# Patient Record
Sex: Female | Born: 1950
Health system: Southern US, Community
[De-identification: ages and names within clinical notes are randomized; demographics above are authoritative.]

## PROBLEM LIST (undated history)

## (undated) DIAGNOSIS — E785 Hyperlipidemia, unspecified: Secondary | ICD-10-CM

## (undated) DIAGNOSIS — M1990 Unspecified osteoarthritis, unspecified site: Secondary | ICD-10-CM

## (undated) DIAGNOSIS — D649 Anemia, unspecified: Secondary | ICD-10-CM

## (undated) DIAGNOSIS — I10 Essential (primary) hypertension: Secondary | ICD-10-CM

## (undated) DIAGNOSIS — J45909 Unspecified asthma, uncomplicated: Secondary | ICD-10-CM

## (undated) DIAGNOSIS — E119 Type 2 diabetes mellitus without complications: Secondary | ICD-10-CM

## (undated) DIAGNOSIS — F419 Anxiety disorder, unspecified: Secondary | ICD-10-CM

## (undated) HISTORY — PX: ABDOMINAL HYSTERECTOMY: SHX81

## (undated) HISTORY — PX: BACK SURGERY: SHX140

## (undated) HISTORY — PX: ECTOPIC PREGNANCY SURGERY: SHX613

## (undated) HISTORY — PX: TUBAL LIGATION: SHX77

## (undated) HISTORY — DX: Hyperlipidemia, unspecified: E78.5

---

## 2019-02-22 ENCOUNTER — Other Ambulatory Visit: Payer: Self-pay | Admitting: Internal Medicine

## 2019-02-22 DIAGNOSIS — Z1231 Encounter for screening mammogram for malignant neoplasm of breast: Secondary | ICD-10-CM

## 2019-02-22 DIAGNOSIS — I10 Essential (primary) hypertension: Secondary | ICD-10-CM | POA: Diagnosis not present

## 2019-02-22 DIAGNOSIS — E119 Type 2 diabetes mellitus without complications: Secondary | ICD-10-CM | POA: Diagnosis not present

## 2019-02-22 DIAGNOSIS — Z13820 Encounter for screening for osteoporosis: Secondary | ICD-10-CM | POA: Diagnosis not present

## 2019-02-22 DIAGNOSIS — M48061 Spinal stenosis, lumbar region without neurogenic claudication: Secondary | ICD-10-CM | POA: Diagnosis not present

## 2019-02-22 DIAGNOSIS — E210 Primary hyperparathyroidism: Secondary | ICD-10-CM | POA: Diagnosis not present

## 2019-02-22 DIAGNOSIS — Z0000 Encounter for general adult medical examination without abnormal findings: Secondary | ICD-10-CM | POA: Diagnosis not present

## 2019-02-22 DIAGNOSIS — M5137 Other intervertebral disc degeneration, lumbosacral region: Secondary | ICD-10-CM | POA: Diagnosis not present

## 2019-02-28 DIAGNOSIS — Z780 Asymptomatic menopausal state: Secondary | ICD-10-CM | POA: Diagnosis not present

## 2019-03-02 DIAGNOSIS — E213 Hyperparathyroidism, unspecified: Secondary | ICD-10-CM | POA: Diagnosis not present

## 2019-03-10 DIAGNOSIS — I10 Essential (primary) hypertension: Secondary | ICD-10-CM | POA: Diagnosis not present

## 2019-03-10 DIAGNOSIS — E213 Hyperparathyroidism, unspecified: Secondary | ICD-10-CM | POA: Diagnosis not present

## 2019-03-11 DIAGNOSIS — I10 Essential (primary) hypertension: Secondary | ICD-10-CM | POA: Diagnosis not present

## 2019-03-16 DIAGNOSIS — E213 Hyperparathyroidism, unspecified: Secondary | ICD-10-CM | POA: Diagnosis not present

## 2019-03-23 DIAGNOSIS — I10 Essential (primary) hypertension: Secondary | ICD-10-CM | POA: Diagnosis not present

## 2019-03-23 DIAGNOSIS — Z0000 Encounter for general adult medical examination without abnormal findings: Secondary | ICD-10-CM | POA: Diagnosis not present

## 2019-03-30 DIAGNOSIS — I10 Essential (primary) hypertension: Secondary | ICD-10-CM | POA: Diagnosis not present

## 2019-03-30 DIAGNOSIS — E213 Hyperparathyroidism, unspecified: Secondary | ICD-10-CM | POA: Diagnosis not present

## 2019-03-30 DIAGNOSIS — E042 Nontoxic multinodular goiter: Secondary | ICD-10-CM | POA: Diagnosis not present

## 2019-04-06 DIAGNOSIS — I10 Essential (primary) hypertension: Secondary | ICD-10-CM | POA: Diagnosis not present

## 2019-04-06 DIAGNOSIS — E210 Primary hyperparathyroidism: Secondary | ICD-10-CM | POA: Diagnosis not present

## 2019-04-06 DIAGNOSIS — E042 Nontoxic multinodular goiter: Secondary | ICD-10-CM | POA: Diagnosis not present

## 2019-04-07 DIAGNOSIS — E042 Nontoxic multinodular goiter: Secondary | ICD-10-CM | POA: Diagnosis not present

## 2019-04-15 ENCOUNTER — Other Ambulatory Visit: Payer: Self-pay

## 2019-04-15 ENCOUNTER — Encounter: Payer: Self-pay | Admitting: Emergency Medicine

## 2019-04-15 ENCOUNTER — Emergency Department
Admission: EM | Admit: 2019-04-15 | Discharge: 2019-04-15 | Disposition: A | Discharge status: Home / Self Care | Payer: PPO | Attending: Emergency Medicine | Admitting: Emergency Medicine

## 2019-04-15 ENCOUNTER — Emergency Department: Payer: PPO

## 2019-04-15 DIAGNOSIS — E119 Type 2 diabetes mellitus without complications: Secondary | ICD-10-CM | POA: Insufficient documentation

## 2019-04-15 DIAGNOSIS — I10 Essential (primary) hypertension: Secondary | ICD-10-CM | POA: Diagnosis not present

## 2019-04-15 DIAGNOSIS — R42 Dizziness and giddiness: Secondary | ICD-10-CM | POA: Diagnosis not present

## 2019-04-15 DIAGNOSIS — R0789 Other chest pain: Secondary | ICD-10-CM | POA: Diagnosis not present

## 2019-04-15 HISTORY — DX: Essential (primary) hypertension: I10

## 2019-04-15 HISTORY — DX: Type 2 diabetes mellitus without complications: E11.9

## 2019-04-15 LAB — BASIC METABOLIC PANEL
Anion gap: 6 (ref 5–15)
BUN: 15 mg/dL (ref 8–23)
CO2: 23 mmol/L (ref 22–32)
Calcium: 11.1 mg/dL — ABNORMAL HIGH (ref 8.9–10.3)
Chloride: 108 mmol/L (ref 98–111)
Creatinine, Ser: 0.75 mg/dL (ref 0.44–1.00)
GFR calc Af Amer: >60 mL/min (ref >60)
GFR calc non Af Amer: >60 mL/min (ref >60)
Glucose, Bld: 150 mg/dL — ABNORMAL HIGH (ref 70–99)
Potassium: 4.1 mmol/L (ref 3.5–5.1)
Sodium: 137 mmol/L (ref 135–145)

## 2019-04-15 LAB — CBC
HCT: 40.1 % (ref 36.0–46.0)
Hemoglobin: 13.4 g/dL (ref 12.0–15.0)
MCH: 30 pg (ref 26.0–34.0)
MCHC: 33.4 g/dL (ref 30.0–36.0)
MCV: 89.9 fL (ref 80.0–100.0)
Platelets: 278 10*3/uL (ref 150–400)
RBC: 4.46 MIL/uL (ref 3.87–5.11)
RDW: 13.4 % (ref 11.5–15.5)
WBC: 7.9 10*3/uL (ref 4.0–10.5)
nRBC: 0 % (ref 0.0–0.2)

## 2019-04-15 LAB — TROPONIN I (HIGH SENSITIVITY)
Troponin I (High Sensitivity): 9 ng/L (ref <18)
Troponin I (High Sensitivity): 10 ng/L (ref <18)

## 2019-04-15 MED ORDER — SODIUM CHLORIDE 0.9% FLUSH: 3.0000 mL | Freq: Once | INTRAVENOUS | Status: DC

## 2019-04-15 NOTE — ED Provider Notes (Signed)
[facility] Emergency Department Provider Note   ____________________________________________   First MD Initiated Contact with Patient 04/15/19 2033     (approximate)  I have reviewed the triage vital signs and the nursing notes.   HISTORY  Chief Complaint Lightheadedness    HPI Mariah Proctor is a 69 y.o. female has been experiencing lightheadedness   Patient reports that she has been having a slight bit of a nervous feeling as well as a lightheaded feeling that been ongoing for about a week.  Is more prominent when she is up and about.  She been having her blood pressure medications adjusted and was taken off of her fluid pill and placed on new blood pressure medicine by her primary doctor Dr. Tressia Miners  Denies numbness or weakness in arms or legs.  She does not feel a spinning sensation.  She reports she just has a feeling that is like a lightheadedness has been going off and on for a few days now  She thinks her symptoms might be related to that.  Symptoms are more prominent when she stands.  Tends to go away when she is at rest.  She has been suffering from high blood pressure  Patient denies having chest pain.  She reports she gets a little bit of soreness around her thyroid which is been an issue that is being followed by her doctor.  She reports is not having chest pain.  The thyroid discomfort is not new located in her upper chest lower neck  She has not felt like she could pass out.    Past Medical History:  Diagnosis Date  . Diabetes mellitus without complication ([place])   . Hypertension     There are no problems to display for this patient.   Past Surgical History:  Procedure Laterality Date  . ABDOMINAL HYSTERECTOMY    . BACK SURGERY    . ECTOPIC PREGNANCY SURGERY      Prior to Admission medications   Not on File    Allergies Patient has no known allergies.  History reviewed. No pertinent family history.  Social  History Social History   Tobacco Use  . Smoking status: Never Smoker  . Smokeless tobacco: Never Used  Substance Use Topics  . Alcohol use: Never  . Drug use: Never    Review of Systems Constitutional: No fever/chills Eyes: No visual changes. ENT: No sore throat.  Some discomfort over the front of her neck which has been present for some time Cardiovascular: Denies chest pain. Respiratory: Denies shortness of breath.   Gastrointestinal: No abdominal pain.   Genitourinary: Negative for dysuria. Musculoskeletal: Negative for back pain. Skin: Negative for rash. Neurological: Negative for headaches, areas of focal weakness or numbness.  Reports feeling of "lightheadedness"    ____________________________________________   PHYSICAL EXAM:  VITAL SIGNS: ED Triage Vitals  Enc Vitals Group     BP 04/15/19 1657 (!) 157/65     Pulse Rate 04/15/19 1657 (!) 101     Resp 04/15/19 1657 20     Temp 04/15/19 1657 98.7 F (37.1 C)     Temp Source 04/15/19 1657 Oral     SpO2 04/15/19 1657 94 %     Weight 04/15/19 1656 255 lb (115.7 kg)     Height 04/15/19 1656 5\' 8"  (1.727 m)     Head Circumference --      Peak Flow --      Pain Score 04/15/19 1656 0  Pain Loc --      Pain Edu? --      Excl. in [place]? --     Constitutional: Alert and oriented. Well appearing and in no acute distress. Eyes: Conjunctivae are normal. Head: Atraumatic. Nose: No congestion/rhinnorhea. Mouth/Throat: Mucous membranes are moist. Neck: No stridor.  Cardiovascular: Normal rate, regular rhythm. Grossly normal heart sounds.  Good peripheral circulation. Respiratory: Normal respiratory effort.  No retractions. Lungs CTAB. Gastrointestinal: Soft and nontender. No distention. Musculoskeletal: No lower extremity tenderness nor edema. Neurologic:  Normal speech and language. No gross focal neurologic deficits are appreciated.  Moves all extremities with 5 out of 5 strength.  Able to stand independently  without difficulty.  No central ataxia.  Clear speech.  Normal facial movements without droop. Skin:  Skin is warm, dry and intact. No rash noted. Psychiatric: Mood and affect are normal. Speech and behavior are normal.  I personally checked orthostatics on the patient, she does not have orthostatic hypotension.  Her blood pressure actually went up slightly with standing to as high as 231/121. ____________________________________________   LABS (all labs ordered are listed, but only abnormal results are displayed)  Labs Reviewed  BASIC METABOLIC PANEL - Abnormal; Notable for the following components:      Result Value   Glucose, Bld 150 (*)    Calcium 11.1 (*)    All other components within normal limits  CBC  TROPONIN I (HIGH SENSITIVITY)  TROPONIN I (HIGH SENSITIVITY)   ____________________________________________  EKG  Reviewed interpreted by me at 1700 Heart rate 110 QRS 80 QTc 410 Mild sinus tachycardia, probable early repolarization type abnormality.  EKG discussed with cardiology Dr. Audie Box ____________________________________________  RADIOLOGY  DG Chest 2 View  Result Date: 04/15/2019 CLINICAL DATA:  Chest tightness EXAM: CHEST - 2 VIEW COMPARISON:  None. FINDINGS: The heart size and mediastinal contours are within normal limits. Both lungs are clear. The visualized skeletal structures show degenerative changes of the thoracic spine. IMPRESSION: No active cardiopulmonary disease. Electronically Signed   By: Inez Catalina M.D.   On: 04/15/2019 17:29    Chest x-ray reviewed negative for acute  Do not see indication for neuroimaging.  No neurologic symptoms reported, does report some lightheadedness.  No headaches.  Reassuring neurologic exam ____________________________________________   PROCEDURES  Procedure(s) performed: None  Procedures  Critical Care performed: No  ____________________________________________   INITIAL IMPRESSION / ASSESSMENT AND PLAN  / ED COURSE  Pertinent labs & imaging results that were available during my care of the patient were reviewed by me and considered in my medical decision making (see chart for details).   Patient presents for evaluation for lightheadedness.  This in the setting of recent blood pressure medication adjustments.  She has reassuring neurologic examination.  Denies cardiac symptoms.  Does report some discomfort across the front of her thyroid region where she has had this evaluated and denies this to be new  Clinical Course as of Apr 14 2236  Fri Apr 15, 2019  2232 Patient seated resting comfortably in chair.  Blood pressure currently 159/93.  Discussed her case with cardiology Dr. Ulyses Amor, and reports quite a bit of room to titrate her medications.  Her blood pressure seems extremely variable.  It is now improved, and she is resting quite comfortably.  Discussed with the patient and after discussion with cardiology will trial changing her hydralazine to 3 times daily instead of twice daily dosing, have her follow-up with her primary care Dr. Tressia Miners.  Patient very comfortable  with this plan  Return precautions and treatment recommendations and follow-up discussed with the patient who is agreeable with the plan.    [MQ]    Clinical Course User Index [MQ] Delman Kitten, MD   No evidence of acute neurologic cardiac or vascular etiology.  Discussed with the patient, we will up titrate her hydralazine to 3 times daily, she will continue to follow her blood pressures at home and follow closely with Dr. Tressia Miners.  Patient comfortable with this plan  2 normal troponins.  No chest pain.  No evidence of ACS  Return precautions and treatment recommendations and follow-up discussed with the patient who is agreeable with the plan.   ____________________________________________   FINAL CLINICAL IMPRESSION(S) / ED DIAGNOSES  Final diagnoses:  Hypertension, unspecified type  Lightheadedness         Note:  This document was prepared using Dragon voice recognition software and may include unintentional dictation errors       Delman Kitten, MD 04/15/19 2239

## 2019-04-15 NOTE — Discharge Instructions (Addendum)
Please follow-up closely with Dr. Tressia Miners.  Please change your hydralazine from twice daily to 3 times a day dosing, which would be roughly every 8 hours.  This may help to better control your blood pressures.  Return the emergency room right away if you have worsening symptoms, begin experiencing chest pain, trouble breathing, weakness in arm or leg, trouble speaking or other new concerns or symptoms arise

## 2019-04-15 NOTE — ED Notes (Addendum)
Pt states she has had dizzy spells w/ lightheadedness off and on since last Sunday. Pt went to get checked at [facility] clinic walkin and was sent to this ED for HTN. Pt states she has hx of HTN but has been compliant w/ her medication.  Pt is denying chest pain at this time.

## 2019-04-15 NOTE — ED Triage Notes (Signed)
Pt presents to ED via POV, pt c/o dizziness, substernal chest tightness, and a "nervous feeling". Pt states symptoms started on Sunday and have progressively worsened. Pt A&O x 4. NAD noted in triage.

## 2019-04-15 NOTE — ED Notes (Signed)
Family updated as to patient's status. Called pt's husband Rufus.

## 2019-04-21 ENCOUNTER — Other Ambulatory Visit: Payer: Self-pay

## 2019-04-21 ENCOUNTER — Ambulatory Visit: Payer: PPO | Admitting: General Surgery

## 2019-04-21 ENCOUNTER — Encounter: Payer: Self-pay | Admitting: General Surgery

## 2019-04-21 VITALS — BP 165/82 | HR 111 | Temp 97.7°F | Resp 12 | Ht 65.0 in | Wt 253.0 lb

## 2019-04-21 DIAGNOSIS — E210 Primary hyperparathyroidism: Secondary | ICD-10-CM | POA: Diagnosis not present

## 2019-04-21 NOTE — H&P (View-Only) (Signed)
Patient ID: Mariah Proctor, female   DOB: 12-23-1950, 69 y.o.   MRN: [id number]  Chief Complaint  Patient presents with   New Patient (Initial Visit)    Parathyroidectomy     HPI Mariah Proctor is a 69 y.o. female.   She has been referred by Dr. Gabriel Carina for surgical evaluation of hypercalcemia and hyperparathyroidism.  She moved to [place] from [place] in November 2020.  She reports that her primary care provider in [place] had notified her, just prior to her move, that her calcium was high.  He placed her on 30 mg of Sensipar daily.  She did not tolerate the GI side effects and did not take it for very long.  After establishing care with Dr. Tressia Miners, she was found again on routine laboratory work to have an elevated calcium.  This prompted a referral to endocrinology.  At the time, she was taking a combination antihypertensive that included hydrochlorothiazide.  Her calcium was 12.7 with a concomitant PTH of 73.  Vitamin D levels were normal.  Additional testing ensued, including a repeat calcium value off of hydrochlorothiazide.  It was still 11.  SPEP and UPEP were negative, as was her GFR.  Vitamin D was also normal.  She had a bone density study that was within normal limits.  She has been had an ultrasound of the head neck that demonstrated multiple thyroid nodules, as well as a right-sided structure most consistent with a parathyroid adenoma.  She has undergone biopsy of 3 thyroid nodules, all of which were benign.    She denies any history of nephrolithiasis, peptic ulcer disease, pancreatitis.  She has never had a nontraumatic fracture.  She denies constipation and pruritus.  She does endorse occasional foggy thinking but states that she feels like her memory is good.  She reports drinking a lot of water and endorses 2-3 episodes of nocturia on a regular basis.  She says that she feels like she has decreased initiative or a depressed mood, although she does not call it  depression.  There is no family history of hyperparathyroidism, jaw tumors, or multiple endocrine neoplasia.  She does have 1 sister with hypercalcemia secondary to multiple myeloma.  No history of therapeutic or occupational exposure to ionizing radiation.   Past Medical History:  Diagnosis Date   Diabetes mellitus without complication ([place])    Hypertension     Past Surgical History:  Procedure Laterality Date   ABDOMINAL HYSTERECTOMY     BACK SURGERY     ECTOPIC PREGNANCY SURGERY      Family History  Problem Relation Age of Onset   Aneurysm Mother    Hypertension Mother    Colon cancer Father     Social History Social History   Tobacco Use   Smoking status: Never Smoker   Smokeless tobacco: Never Used  Substance Use Topics   Alcohol use: Never   Drug use: Never    No Known Allergies  Current Outpatient Medications  Medication Sig Dispense Refill   albuterol (VENTOLIN HFA) 108 (90 Base) MCG/ACT inhaler Inhale into the lungs.     amLODipine (NORVASC) 10 MG tablet Take by mouth.     aspirin 81 MG EC tablet Take by mouth.     atenolol (TENORMIN) 50 MG tablet Take 50 mg by mouth daily.     blood glucose meter kit and supplies by XX route as directed     cetirizine-pseudoephedrine (ZYRTEC-D) 5-120 MG tablet Take by mouth.  Cholecalciferol (VITAMIN D3) 10 MCG (400 UNIT) tablet Take by mouth.     fluticasone (FLONASE) 50 MCG/ACT nasal spray Place into the nose.     hydrALAZINE (APRESOLINE) 50 MG tablet Take by mouth.     metFORMIN (GLUCOPHAGE-XR) 500 MG 24 hr tablet Take 500 mg by mouth 2 (two) times daily.     Multiple Vitamins-Minerals (PX COMPLETE SENIOR MULTIVITS) TABS Take by mouth.     Omega 3 1000 MG CAPS Take by mouth.     pravastatin (PRAVACHOL) 40 MG tablet      telmisartan (MICARDIS) 80 MG tablet Take by mouth.     No current facility-administered medications for this visit.    Review of Systems Review of Systems  All other  systems reviewed and are negative.   Blood pressure (!) 165/82, pulse (!) 111, temperature 97.7 F (36.5 C), resp. rate 12, height 5' 5"  (1.651 m), weight 253 lb (114.8 kg), SpO2 98 %. Body mass index is 42.1 kg/m.  Physical Exam Physical Exam Constitutional:      General: She is not in acute distress.    Appearance: Normal appearance. She is obese.  HENT:     Head: Normocephalic and atraumatic.     Nose:     Comments: Covered with a mask secondary to COVID-19 precautions    Mouth/Throat:     Comments: Covered with a mask secondary to COVID-19 precautions Eyes:     General: No scleral icterus.       Right eye: No discharge.        Left eye: No discharge.     Conjunctiva/sclera: Conjunctivae normal.  Neck:     Comments: The thyroid is enlarged with an irregular surface.  It feels like there is a dominant nodule directly overlying the trachea, within the isthmus.  The gland moves freely with deglutition. Cardiovascular:     Rate and Rhythm: Normal rate and regular rhythm.     Pulses: Normal pulses.  Pulmonary:     Effort: Pulmonary effort is normal.     Breath sounds: Normal breath sounds.  Abdominal:     General: Bowel sounds are normal.     Palpations: Abdomen is soft.  Genitourinary:    Comments: Deferred Musculoskeletal:        General: No swelling or tenderness.  Lymphadenopathy:     Cervical: No cervical adenopathy.  Skin:    General: Skin is warm and dry.  Neurological:     General: No focal deficit present.     Mental Status: She is alert and oriented to person, place, and time.  Psychiatric:        Mood and Affect: Mood normal.        Behavior: Behavior normal.        Thought Content: Thought content normal.     Data Reviewed I reviewed Dr. Joycie Peek notes from both January and February 2021, wherein she delineated her evaluation of the patient's hypercalcemia, along with her elimination of hydrochlorothiazide from the patient's medication regimen.  I also  reviewed the report of the ultrasound performed, which is copied here:  Indication: Hyperparathyroidism  Comparison: None  Technique: Gray-scale and color Doppler images of the neck were obtained.  Findings: THYROID: The right lobe of the thyroid measures 7.4 x 0.8 x 0.8 cm. The left lobe of the thyroid measures 7.6 x 3.2 x 2.8 cm. The isthmus measures 1.8 cm in AP depth.  Echotexture of the thyroid is homogeneous.  Within the right lobe, there  are no nodules.  In the isthmus there is a 3.0 x 1.6 x 2.5 cm heterogeneous nodule.  Within the left lobe, there is a mid pole 2.4 x 1.8 x 2.5 cm heterogeneous  nodule and a lower pole 4.0 x 2.3 x 4.1 cm heterogeneous nodule  overlapping into the isthmus.  There are no significantly enlarged lymph nodes in the neck.  Adjacent to the lower pole of the right lobe is a 2.0 x 0.8 x 0.8 cm  hypoechoic mass with a vascular pedicle which may be a parathyroid gland.   Impression: Homogeneous thyroid gland which is enlarged. There are multiple nodules  throughout the thyroid gland. This is consistent with a multinodular  goiter.  There are three thyroid nodules. There is a 3.0 cm isthmus nodule and two  left-sided nodules measuring up to 2.5 cm and 4.0 cm. Consider fine needle  aspiration biopsy of these nodules.   There is 2.0 cm mass adjacent to the right lower pole of the thyroid gland  which may be a parathyroid adenoma.   I was not able to view the actual images, so I briefly placed an ultrasound probe on her neck and I concur with the impression from the radiologist.  I did not perform a full evaluation of the head and neck.  Bone densitometry from March 01, 2019 is copied here:  [facility]- DEXA Report    REFERRING MD: Tressia Miners                        TECHNICIAN:  Johnnye Sima HISTORY: BASELINE BONE DENSITY. 69 YEAR OLD BLACK FEMALE.    SITE DATE BMD g/cm  T-SCORE Z-SCORE g/cm CHANGE %  CHANGE STATISTICALLY     SIGNIFICANT?  LUMBAR SPINE L1- L4           HIP L.FEM. NECK 02-28-19 0.744 -0.9           L.TOTAL HIP 02-28-19 1.124 +1.5          FOREARM L/R 33% 02-28-19 0.756 +1.0     This is normal bone density, without osteopenia or osteoporosis.  135 (H) 119 (H)  141 140  4.4 4.5  108 103  25.9 30.2  11.0 (H) 12.7 (H)  16 20  0.9 1.1  76 60 (L)  17.8   11.5    28   39 (H)   64   4.6   0.5   6.8   2.1   2.4 (L)   6.2     3.9    0.2    0.6    0.9    0.7    Not Observed    2.3    1.7   Comment Comment    24.5    100    0    0    0    0    Not Observed     73 (H)    48.7   These labs demonstrate findings discussed in the history of present illness, namely normal renal function, normal SPEP and UPEP, elevated intact PTH, normal MND, elevated calcium.  Assessment This is a 69 year old woman with biochemical evidence of primary hyperparathyroidism, as well as a right-sided parathyroid adenoma appreciated on ultrasound imaging.  I have recommended that she undergo parathyroidectomy.   Plan The risks of parathyroid surgery were discussed with the patient, including (but not limited to): bleeding, infection, damage to surrounding structures/tissues, injury (temporary or permanent) to  the recurrent laryngeal nerve, hypocalcemia (temporary or permanent), need to take calcium and/or vitamin D supplementation, recurrent hyperparathyroidism, failure to correct hyperparathyroidism, need for additional surgery.  The patient had the opportunity to ask any questions and these were answered to their satisfaction.  She is eager to have her operation as soon as possible, so we will work to accommodate this.    Fredirick Maudlin 04/21/2019, 2:37 PM

## 2019-04-21 NOTE — Progress Notes (Signed)
Patient ID: Mariah Proctor, female   DOB: [birth year], 69 y.o.   MRN: [id number]  Chief Complaint  Patient presents with  . New Patient (Initial Visit)    Parathyroidectomy     HPI Mariah Proctor is a 69 y.o. female.   She has been referred by Dr. Gabriel Carina for surgical evaluation of hypercalcemia and hyperparathyroidism.  She moved to [place] from [place] in November 2020.  She reports that her primary care provider in [place] had notified her, just prior to her move, that her calcium was high.  He placed her on 30 mg of Sensipar daily.  She did not tolerate the GI side effects and did not take it for very long.  After establishing care with Dr. Tressia Miners, she was found again on routine laboratory work to have an elevated calcium.  This prompted a referral to endocrinology.  At the time, she was taking a combination antihypertensive that included hydrochlorothiazide.  Her calcium was 12.7 with a concomitant PTH of 73.  Vitamin D levels were normal.  Additional testing ensued, including a repeat calcium value off of hydrochlorothiazide.  It was still 11.  SPEP and UPEP were negative, as was her GFR.  Vitamin D was also normal.  She had a bone density study that was within normal limits.  She has been had an ultrasound of the head neck that demonstrated multiple thyroid nodules, as well as a right-sided structure most consistent with a parathyroid adenoma.  She has undergone biopsy of 3 thyroid nodules, all of which were benign.    She denies any history of nephrolithiasis, peptic ulcer disease, pancreatitis.  She has never had a nontraumatic fracture.  She denies constipation and pruritus.  She does endorse occasional foggy thinking but states that she feels like her memory is good.  She reports drinking a lot of water and endorses 2-3 episodes of nocturia on a regular basis.  She says that she feels like she has decreased initiative or a depressed mood, although she does not call it  depression.  There is no family history of hyperparathyroidism, jaw tumors, or multiple endocrine neoplasia.  She does have 1 sister with hypercalcemia secondary to multiple myeloma.  No history of therapeutic or occupational exposure to ionizing radiation.   Past Medical History:  Diagnosis Date  . Diabetes mellitus without complication ([facility])   . Hypertension     Past Surgical History:  Procedure Laterality Date  . ABDOMINAL HYSTERECTOMY    . BACK SURGERY    . ECTOPIC PREGNANCY SURGERY      Family History  Problem Relation Age of Onset  . Aneurysm Mother   . Hypertension Mother   . Colon cancer Father     Social History Social History   Tobacco Use  . Smoking status: Never Smoker  . Smokeless tobacco: Never Used  Substance Use Topics  . Alcohol use: Never  . Drug use: Never    No Known Allergies  Current Outpatient Medications  Medication Sig Dispense Refill  . albuterol (VENTOLIN HFA) 108 (90 Base) MCG/ACT inhaler Inhale into the lungs.    Marland Kitchen amLODipine (NORVASC) 10 MG tablet Take by mouth.    Marland Kitchen aspirin 81 MG EC tablet Take by mouth.    Marland Kitchen atenolol (TENORMIN) 50 MG tablet Take 50 mg by mouth daily.    . blood glucose meter kit and supplies by XX route as directed    . cetirizine-pseudoephedrine (ZYRTEC-D) 5-120 MG tablet Take by mouth.    Marland Kitchen  Cholecalciferol (VITAMIN D3) 10 MCG (400 UNIT) tablet Take by mouth.    . fluticasone (FLONASE) 50 MCG/ACT nasal spray Place into the nose.    . hydrALAZINE (APRESOLINE) 50 MG tablet Take by mouth.    . metFORMIN (GLUCOPHAGE-XR) 500 MG 24 hr tablet Take 500 mg by mouth 2 (two) times daily.    . Multiple Vitamins-Minerals (PX COMPLETE SENIOR MULTIVITS) TABS Take by mouth.    . Omega 3 1000 MG CAPS Take by mouth.    . pravastatin (PRAVACHOL) 40 MG tablet     . telmisartan (MICARDIS) 80 MG tablet Take by mouth.     No current facility-administered medications for this visit.    Review of Systems Review of Systems  All other  systems reviewed and are negative.   Blood pressure (!) 165/82, pulse (!) 111, temperature 97.7 F (36.5 C), resp. rate 12, height 5' 5"  (1.651 m), weight 253 lb (114.8 kg), SpO2 98 %. Body mass index is 42.1 kg/m.  Physical Exam Physical Exam Constitutional:      General: She is not in acute distress.    Appearance: Normal appearance. She is obese.  HENT:     Head: Normocephalic and atraumatic.     Nose:     Comments: Covered with a mask secondary to COVID-19 precautions    Mouth/Throat:     Comments: Covered with a mask secondary to COVID-19 precautions Eyes:     General: No scleral icterus.       Right eye: No discharge.        Left eye: No discharge.     Conjunctiva/sclera: Conjunctivae normal.  Neck:     Comments: The thyroid is enlarged with an irregular surface.  It feels like there is a dominant nodule directly overlying the trachea, within the isthmus.  The gland moves freely with deglutition. Cardiovascular:     Rate and Rhythm: Normal rate and regular rhythm.     Pulses: Normal pulses.  Pulmonary:     Effort: Pulmonary effort is normal.     Breath sounds: Normal breath sounds.  Abdominal:     General: Bowel sounds are normal.     Palpations: Abdomen is soft.  Genitourinary:    Comments: Deferred Musculoskeletal:        General: No swelling or tenderness.  Lymphadenopathy:     Cervical: No cervical adenopathy.  Skin:    General: Skin is warm and dry.  Neurological:     General: No focal deficit present.     Mental Status: She is alert and oriented to person, place, and time.  Psychiatric:        Mood and Affect: Mood normal.        Behavior: Behavior normal.        Thought Content: Thought content normal.     Data Reviewed I reviewed Dr. Joycie Peek notes from both January and February 2021, wherein she delineated her evaluation of the patient's hypercalcemia, along with her elimination of hydrochlorothiazide from the patient's medication regimen.  I also  reviewed the report of the ultrasound performed, which is copied here:  Indication: Hyperparathyroidism  Comparison: None  Technique: Gray-scale and color Doppler images of the neck were obtained.  Findings: THYROID: The right lobe of the thyroid measures 7.4 x 0.8 x 0.8 cm. The left lobe of the thyroid measures 7.6 x 3.2 x 2.8 cm. The isthmus measures 1.8 cm in AP depth.  Echotexture of the thyroid is homogeneous.  Within the right lobe, there  are no nodules.  In the isthmus there is a 3.0 x 1.6 x 2.5 cm heterogeneous nodule.  Within the left lobe, there is a mid pole 2.4 x 1.8 x 2.5 cm heterogeneous  nodule and a lower pole 4.0 x 2.3 x 4.1 cm heterogeneous nodule  overlapping into the isthmus.  There are no significantly enlarged lymph nodes in the neck.  Adjacent to the lower pole of the right lobe is a 2.0 x 0.8 x 0.8 cm  hypoechoic mass with a vascular pedicle which may be a parathyroid gland.   Impression: Homogeneous thyroid gland which is enlarged. There are multiple nodules  throughout the thyroid gland. This is consistent with a multinodular  goiter.  There are three thyroid nodules. There is a 3.0 cm isthmus nodule and two  left-sided nodules measuring up to 2.5 cm and 4.0 cm. Consider fine needle  aspiration biopsy of these nodules.   There is 2.0 cm mass adjacent to the right lower pole of the thyroid gland  which may be a parathyroid adenoma.   I was not able to view the actual images, so I briefly placed an ultrasound probe on her neck and I concur with the impression from the radiologist.  I did not perform a full evaluation of the head and neck.  Bone densitometry from March 01, 2019 is copied here:  [facility]- DEXA Report    REFERRING MD: Tressia Miners                        TECHNICIAN:  Johnnye Sima HISTORY: BASELINE BONE DENSITY. 69 YEAR OLD BLACK FEMALE.    SITE DATE BMD g/cm  T-SCORE Z-SCORE g/cm CHANGE %  CHANGE STATISTICALLY     SIGNIFICANT?  LUMBAR SPINE L1- L4           HIP L.FEM. NECK 02-28-19 0.744 -0.9           L.TOTAL HIP 02-28-19 1.124 +1.5          FOREARM L/R 33% 02-28-19 0.756 +1.0     This is normal bone density, without osteopenia or osteoporosis.  135 (H) 119 (H)  141 140  4.4 4.5  108 103  25.9 30.2  11.0 (H) 12.7 (H)  16 20  0.9 1.1  76 60 (L)  17.8   11.5    28   39 (H)   64   4.6   0.5   6.8   2.1   2.4 (L)   6.2     3.9    0.2    0.6    0.9    0.7    Not Observed    2.3    1.7   Comment Comment    24.5    100    0    0    0    0    Not Observed     73 (H)    48.7   These labs demonstrate findings discussed in the history of present illness, namely normal renal function, normal SPEP and UPEP, elevated intact PTH, normal MND, elevated calcium.  Assessment This is a 69 year old woman with biochemical evidence of primary hyperparathyroidism, as well as a right-sided parathyroid adenoma appreciated on ultrasound imaging.  I have recommended that she undergo parathyroidectomy.   Plan The risks of parathyroid surgery were discussed with the patient, including (but not limited to): bleeding, infection, damage to surrounding structures/tissues, injury (temporary or permanent) to  the recurrent laryngeal nerve, hypocalcemia (temporary or permanent), need to take calcium and/or vitamin D supplementation, recurrent hyperparathyroidism, failure to correct hyperparathyroidism, need for additional surgery.  The patient had the opportunity to ask any questions and these were answered to their satisfaction.  She is eager to have her operation as soon as possible, so we will work to accommodate this.    Fredirick Maudlin 04/21/2019, 2:37 PM

## 2019-04-21 NOTE — Patient Instructions (Signed)
Our surgery scheduler will contact you to schedule our surgery.  Please have the BLUE SHEET available when she calls you.   Parathyroidectomy  A parathyroidectomy is a surgery to remove one or more parathyroid glands. These glands are in the neck. Each gland is very small, about the size of a pea. Most people have four parathyroid glands. The glands produce parathyroid hormone, which helps to control the level of calcium in the body. You may have a parathyroidectomy if your body produces too much parathyroid hormone (hyperparathyroidism). This usually occurs when one or more of your parathyroid glands becomes enlarged from a type of noncancerous tumor (adenoma). Tell a health care provider about:  Any allergies you have.  All medicines you are taking, including vitamins, herbs, eye drops, creams, and over-the-counter medicines.  Any problems you or family members have had with anesthetic medicines.  Any blood disorders you have.  Any surgeries you have had.  Any medical conditions you have.  Whether you are pregnant or may be pregnant. What are the risks? Generally, this is a safe procedure. However, problems may occur, including:  Bleeding.  Infection.  Allergic reactions to medicines.  Damage to the nerves of your voice box (larynx). This can be temporary or long-term (rare).  Damage to nearby structures and organs, such as the skin (scarring), surrounding blood vessels, and nerves in the neck.  Hoarseness. This usually resolves in 24-48 hours.  A condition in which your body does not make enough parathyroid hormone (hypoparathyroidism). This is rare.  Difficulty breathing. This is rare. What happens before the procedure? Staying hydrated Follow instructions from your health care provider about hydration, which may include:  Up to 2 hours before the procedure - you may continue to drink clear liquids, such as water, clear fruit juice, black coffee, and plain tea. Eating  and drinking restrictions Follow instructions from your health care provider about eating and drinking, which may include:  8 hours before the procedure - stop eating heavy meals or foods such as meat, fried foods, or fatty foods.  6 hours before the procedure - stop eating light meals or foods, such as toast or cereal.  6 hours before the procedure - stop drinking milk or drinks that contain milk.  2 hours before the procedure - stop drinking clear liquids. Medicines Ask your health care provider about:  Changing or stopping your regular medicines. This is especially important if you are taking diabetes medicines or blood thinners.  Taking medicines such as aspirin and ibuprofen. These medicines can thin your blood. Do not take these medicines unless your health care provider tells you to take them.  Taking over-the-counter medicines, vitamins, herbs, and supplements. General instructions  You may be asked to shower with a germ-killing soap.  Plan to have someone take you home from the hospital or clinic.  Plan to have a responsible adult care for you for at least 24 hours after you leave the hospital or clinic. This is important. What happens during the procedure?  To lower your risk of infection: ? Your health care team will wash or sanitize their hands. ? Hair may be removed from the surgical area. ? Your skin will be washed with soap.  An IV will be inserted into one of your veins.  You will be given one or more of the following: ? A medicine to help you relax (sedative). ? A medicine to make you fall asleep (general anesthetic).  An incision will be made according to  the type of parathyroidectomy procedure you are having. There are four methods that may be used: ? Open surgery. A single incision will be made in the center of your neck. The incision will be about 2-4 inches long. ? Minimally invasive surgery. A small incision will be made in the side of your neck. This  incision will be about 1-2 inches long. Before the procedure, you might be given an injection of a type of medicine that will help the surgeon to locate the gland. ? Video-assisted surgery. Two small incisions will be made in your neck. One incision is for the instruments that will be used to remove the gland. The other incision is for a tiny camera that will help the surgeon to see inside your neck. ? Endoscopic surgery. An incision will be made just above your collarbone. A small, flexible tube (endoscope) will be inserted through this incision.  Your health care provider may monitor laryngeal nerve function during the procedure for safety reasons.  The gland or glands that are causing problems will be removed.  The incisions will be closed using stitches (sutures) or other methods. The sutures will often be hidden under the skin. The procedure may vary among health care providers and hospitals. What happens after the procedure?  Your blood pressure, heart rate, breathing rate, and blood oxygen level will be monitored until the medicines you were given have worn off.  You will be given pain medicine as needed.  Your provider will check your ability to talk and swallow after the procedure.  You will gradually start to drink liquids and have soft foods as tolerated.  Your blood will be tested to check the calcium level in your body.  Do not drive for 24 hours if you were given a sedative during your procedure. Summary  The parathyroid glands are located in the neck and produce parathyroid hormone, which helps to control the level of calcium in the body.  A parathyroidectomy is a surgery to remove one or more parathyroid glands.  You may have a parathyroidectomy if your body produces too much parathyroid hormone (hyperparathyroidism).  There are four surgical methods that may be used for a parathyroidectomy: open, minimally invasive, video-assisted, and endoscopic.  Generally, this is  a safe procedure. However, problems may occur, including bleeding, infection, and a hoarse or weak voice. This information is not intended to replace advice given to you by your health care provider. Make sure you discuss any questions you have with your health care provider. Document Revised: 01/02/2017 Document Reviewed: 11/25/2016 Elsevier Patient Education  2020 [employer].

## 2019-04-25 ENCOUNTER — Telehealth: Payer: Self-pay | Admitting: General Surgery

## 2019-04-25 NOTE — Telephone Encounter (Signed)
Pt has been advised of pre admission date/time, Covid Testing date and Surgery date.  Surgery Date: 05/20/19 Preadmission Testing Date: 05/10/19 (phone 1p-5p) Covid Testing Date: 05/18/19 - patient advised to go to the [facility] ([facility])  Patient has been made aware to call [phone], between 1-3:00pm the day before surgery, to find out what time to arrive.

## 2019-04-26 DIAGNOSIS — F329 Major depressive disorder, single episode, unspecified: Secondary | ICD-10-CM | POA: Diagnosis not present

## 2019-04-26 DIAGNOSIS — F419 Anxiety disorder, unspecified: Secondary | ICD-10-CM | POA: Diagnosis not present

## 2019-04-26 DIAGNOSIS — E210 Primary hyperparathyroidism: Secondary | ICD-10-CM | POA: Diagnosis not present

## 2019-04-26 DIAGNOSIS — I10 Essential (primary) hypertension: Secondary | ICD-10-CM | POA: Diagnosis not present

## 2019-05-09 ENCOUNTER — Other Ambulatory Visit: Payer: Self-pay | Admitting: General Surgery

## 2019-05-09 DIAGNOSIS — E210 Primary hyperparathyroidism: Secondary | ICD-10-CM

## 2019-05-10 ENCOUNTER — Other Ambulatory Visit: Payer: Self-pay

## 2019-05-10 ENCOUNTER — Encounter
Admission: RE | Admit: 2019-05-10 | Discharge: 2019-05-10 | Disposition: A | Discharge status: Home / Self Care | Payer: PPO | Source: Ambulatory Visit | Attending: General Surgery | Admitting: General Surgery

## 2019-05-10 DIAGNOSIS — Z01818 Encounter for other preprocedural examination: Secondary | ICD-10-CM | POA: Insufficient documentation

## 2019-05-10 HISTORY — DX: Anemia, unspecified: D64.9

## 2019-05-10 HISTORY — DX: Unspecified osteoarthritis, unspecified site: M19.90

## 2019-05-10 HISTORY — DX: Unspecified asthma, uncomplicated: J45.909

## 2019-05-10 HISTORY — DX: Anxiety disorder, unspecified: F41.9

## 2019-05-10 NOTE — Patient Instructions (Signed)
Your procedure is scheduled on: Friday 4/16 Report to [facility] To find out your arrival time please call [phone] between 1PM - 3PM on Thurs. 4/15.  Remember: Instructions that are not followed completely may result in serious medical risk,  up to and including death, or upon the discretion of your surgeon and anesthesiologist your  surgery may need to be rescheduled.     _X__ 1. Do not eat food after midnight the night before your procedure.                 No gum chewing or hard candies. You may drink clear liquids up to 2 hours                 before you are scheduled to arrive for your surgery- DO not drink clear                 liquids within 2 hours of the start of your surgery.                 Clear Liquids include:  water, G2 or                  Gatorade Zero (avoid Red/Purple/Blue), Black Coffee or Tea (Do not add                 anything to coffee or tea). __x___2.   Complete the carbohydrate drink provided to you, 2 hours before arrival.  __X__2.  On the morning of surgery brush your teeth with toothpaste and water, you                may rinse your mouth with mouthwash if you wish.  Do not swallow any toothpaste of mouthwash.     ___ 3.  No Alcohol for 24 hours before or after surgery.   ___ 4.  Do Not Smoke or use e-cigarettes For 24 Hours Prior to Your Surgery.                 Do not use any chewable tobacco products for at least 6 hours prior to                 surgery.  ____  5.  Bring all medications with you on the day of surgery if instructed.   _x___  6.  Notify your doctor if there is any change in your medical condition      (cold, fever, infections).     Do not wear jewelry, make-up, hairpins, clips or nail polish. Do not wear lotions, powders, or perfumes.  Do not shave 48 hours prior to surgery.  Do not bring valuables to the hospital.    [facility] is not responsible for any belongings or  valuables.  Contacts, dentures or bridgework may not be worn into surgery. Leave your suitcase in the car. After surgery it may be brought to your room. For patients admitted to the hospital, discharge time is determined by your treatment team.   Patients discharged the day of surgery will not be allowed to drive home.   Make arrangements for someone to be with you for the first 24 hours of your Same Day Discharge.    Please read over the following fact sheets that you were given:    __x__ Take these medicines the morning of surgery with A SIP OF WATER:    1. ALPRAZolam (XANAX) 0.25 MG tablet if needed  2.  amLODipine (NORVASC) 10 MG tablet  3. hydrALAZINE (APRESOLINE) 50 MG tablet  4.fluticasone (FLONASE) 50 MCG/ACT nasal spray  If needed  5.  6.  ____ Fleet Enema (as directed)   __x__ Use CHG Soap (or wipes) as directed  ____ Use Benzoyl Peroxide Gel as instructed  __x__ Use inhalers on the day of surgery and bring it with you to the hospital  __x__ Stop metformin 2 days prior to surgery  Last dose is on 4/13    ____ Take 1/2 of usual insulin dose the night before surgery. No insulin the morning          of surgery.   __x__ Stop aspirin   On 4/9  _x___ Stop Anti-inflammatories no ibuprofen or aleve after 4/9   ____ Stop supplements until after surgery.    ____ Bring C-Pap to the hospital.

## 2019-05-18 ENCOUNTER — Other Ambulatory Visit
Admission: RE | Admit: 2019-05-18 | Discharge: 2019-05-18 | Disposition: A | Discharge status: Home / Self Care | Payer: PPO | Source: Ambulatory Visit | Attending: General Surgery | Admitting: General Surgery

## 2019-05-18 ENCOUNTER — Other Ambulatory Visit: Payer: Self-pay

## 2019-05-18 DIAGNOSIS — Z01812 Encounter for preprocedural laboratory examination: Secondary | ICD-10-CM | POA: Diagnosis not present

## 2019-05-18 DIAGNOSIS — Z20822 Contact with and (suspected) exposure to covid-19: Secondary | ICD-10-CM | POA: Diagnosis not present

## 2019-05-18 LAB — SARS CORONAVIRUS 2 (TAT 6-24 HRS): SARS Coronavirus 2: NEGATIVE

## 2019-05-20 ENCOUNTER — Encounter: Payer: Self-pay | Admitting: General Surgery

## 2019-05-20 ENCOUNTER — Other Ambulatory Visit: Payer: Self-pay

## 2019-05-20 ENCOUNTER — Ambulatory Visit: Payer: PPO

## 2019-05-20 ENCOUNTER — Ambulatory Visit
Admission: RE | Admit: 2019-05-20 | Discharge: 2019-05-20 | Disposition: A | Discharge status: Home / Self Care | Payer: PPO | Attending: General Surgery | Admitting: General Surgery

## 2019-05-20 ENCOUNTER — Encounter
Admission: RE | Disposition: A | Discharge status: Home / Self Care | Payer: Self-pay | Source: Home / Self Care | Attending: General Surgery

## 2019-05-20 DIAGNOSIS — Z8249 Family history of ischemic heart disease and other diseases of the circulatory system: Secondary | ICD-10-CM | POA: Diagnosis not present

## 2019-05-20 DIAGNOSIS — Z7984 Long term (current) use of oral hypoglycemic drugs: Secondary | ICD-10-CM | POA: Insufficient documentation

## 2019-05-20 DIAGNOSIS — E785 Hyperlipidemia, unspecified: Secondary | ICD-10-CM | POA: Diagnosis not present

## 2019-05-20 DIAGNOSIS — D351 Benign neoplasm of parathyroid gland: Secondary | ICD-10-CM | POA: Diagnosis not present

## 2019-05-20 DIAGNOSIS — I10 Essential (primary) hypertension: Secondary | ICD-10-CM | POA: Diagnosis not present

## 2019-05-20 DIAGNOSIS — E210 Primary hyperparathyroidism: Secondary | ICD-10-CM | POA: Diagnosis not present

## 2019-05-20 DIAGNOSIS — Z79899 Other long term (current) drug therapy: Secondary | ICD-10-CM | POA: Diagnosis not present

## 2019-05-20 DIAGNOSIS — Z7982 Long term (current) use of aspirin: Secondary | ICD-10-CM | POA: Diagnosis not present

## 2019-05-20 DIAGNOSIS — E119 Type 2 diabetes mellitus without complications: Secondary | ICD-10-CM | POA: Insufficient documentation

## 2019-05-20 HISTORY — PX: PARATHYROIDECTOMY: SHX19

## 2019-05-20 LAB — PARATHYROID HORMONE, INTRAOP (ARMC ONLY)
Parathyroid Hormone: 246 pg/mL — ABNORMAL HIGH (ref 12–88)
Parathyroid Hormone: 110 pg/mL — ABNORMAL HIGH (ref 12–88)
Parathyroid Hormone: 44 pg/mL (ref 12–88)
Parathyroid Hormone: 62 pg/mL (ref 12–88)

## 2019-05-20 LAB — GLUCOSE, CAPILLARY
Glucose-Capillary: 154 mg/dL — ABNORMAL HIGH (ref 70–99)
Glucose-Capillary: 169 mg/dL — ABNORMAL HIGH (ref 70–99)

## 2019-05-20 SURGERY — PARATHYROIDECTOMY
Anesthesia: General

## 2019-05-20 MED ORDER — CHLORHEXIDINE GLUCONATE CLOTH 2 % EX PADS
6.0000 | MEDICATED_PAD | Freq: Once | CUTANEOUS | Status: AC
  Administered 2019-05-20: 6 via TOPICAL

## 2019-05-20 MED ORDER — LIDOCAINE HCL (CARDIAC) PF 100 MG/5ML IV SOSY
PREFILLED_SYRINGE | INTRAVENOUS | Status: DC | PRN
  Administered 2019-05-20: 80 mg via INTRAVENOUS
  Administered 2019-05-20: 20 mg via INTRAVENOUS
  Administered 2019-05-20: 40 mg via INTRAVENOUS

## 2019-05-20 MED ORDER — ROCURONIUM BROMIDE 10 MG/ML (PF) SYRINGE
PREFILLED_SYRINGE | INTRAVENOUS | Status: AC
  Filled 2019-05-20: qty 10

## 2019-05-20 MED ORDER — FENTANYL CITRATE (PF) 100 MCG/2ML IJ SOLN
INTRAMUSCULAR | Status: DC | PRN
  Administered 2019-05-20 (×2): 50 ug via INTRAVENOUS

## 2019-05-20 MED ORDER — SODIUM CHLORIDE 0.9 % IV SOLN: INTRAVENOUS | Status: DC

## 2019-05-20 MED ORDER — ONDANSETRON HCL 4 MG/2ML IJ SOLN
INTRAMUSCULAR | Status: DC | PRN
  Administered 2019-05-20: 4 mg via INTRAVENOUS

## 2019-05-20 MED ORDER — GABAPENTIN 300 MG PO CAPS
ORAL_CAPSULE | ORAL | Status: AC
  Filled 2019-05-20: qty 1

## 2019-05-20 MED ORDER — ONDANSETRON HCL 4 MG/2ML IJ SOLN
INTRAMUSCULAR | Status: AC
  Filled 2019-05-20: qty 2

## 2019-05-20 MED ORDER — MIDAZOLAM HCL 2 MG/2ML IJ SOLN
INTRAMUSCULAR | Status: AC
  Filled 2019-05-20: qty 2

## 2019-05-20 MED ORDER — FENTANYL CITRATE (PF) 100 MCG/2ML IJ SOLN
INTRAMUSCULAR | Status: AC
  Filled 2019-05-20: qty 2

## 2019-05-20 MED ORDER — ROCURONIUM BROMIDE 100 MG/10ML IV SOLN
INTRAVENOUS | Status: DC | PRN
  Administered 2019-05-20: 30 mg via INTRAVENOUS
  Administered 2019-05-20 (×2): 10 mg via INTRAVENOUS

## 2019-05-20 MED ORDER — ACETAMINOPHEN 500 MG PO TABS
ORAL_TABLET | ORAL | Status: AC
  Filled 2019-05-20: qty 2

## 2019-05-20 MED ORDER — PHENYLEPHRINE HCL (PRESSORS) 10 MG/ML IV SOLN
INTRAVENOUS | Status: DC | PRN
  Administered 2019-05-20 (×2): 200 ug via INTRAVENOUS
  Administered 2019-05-20 (×2): 100 ug via INTRAVENOUS

## 2019-05-20 MED ORDER — IBUPROFEN 800 MG PO TABS: 800.0000 mg | ORAL_TABLET | Freq: Three times a day (TID) | ORAL | 0 refills | Status: AC | PRN

## 2019-05-20 MED ORDER — SUCCINYLCHOLINE CHLORIDE 200 MG/10ML IV SOSY
PREFILLED_SYRINGE | INTRAVENOUS | Status: AC
  Filled 2019-05-20: qty 10

## 2019-05-20 MED ORDER — SUGAMMADEX SODIUM 500 MG/5ML IV SOLN
INTRAVENOUS | Status: DC | PRN
  Administered 2019-05-20: 400 mg via INTRAVENOUS

## 2019-05-20 MED ORDER — DEXAMETHASONE SODIUM PHOSPHATE 10 MG/ML IJ SOLN
INTRAMUSCULAR | Status: DC | PRN
  Administered 2019-05-20: 5 mg via INTRAVENOUS

## 2019-05-20 MED ORDER — FAMOTIDINE 20 MG PO TABS
20.0000 mg | ORAL_TABLET | Freq: Once | ORAL | Status: AC
  Administered 2019-05-20: 20 mg via ORAL

## 2019-05-20 MED ORDER — CELECOXIB 200 MG PO CAPS
200.0000 mg | ORAL_CAPSULE | ORAL | Status: AC
  Administered 2019-05-20: 200 mg via ORAL

## 2019-05-20 MED ORDER — OXYCODONE HCL 5 MG PO TABS
ORAL_TABLET | ORAL | Status: AC
  Administered 2019-05-20: 5 mg via ORAL
  Filled 2019-05-20: qty 1

## 2019-05-20 MED ORDER — GABAPENTIN 300 MG PO CAPS
300.0000 mg | ORAL_CAPSULE | ORAL | Status: AC
  Administered 2019-05-20: 300 mg via ORAL

## 2019-05-20 MED ORDER — DEXAMETHASONE SODIUM PHOSPHATE 10 MG/ML IJ SOLN
INTRAMUSCULAR | Status: AC
  Filled 2019-05-20: qty 1

## 2019-05-20 MED ORDER — HEMOSTATIC AGENTS (NO CHARGE) OPTIME
TOPICAL | Status: DC | PRN
  Administered 2019-05-20: 1 via TOPICAL

## 2019-05-20 MED ORDER — SODIUM CHLORIDE 0.9 % IV SOLN
INTRAVENOUS | Status: DC | PRN
  Administered 2019-05-20: 50 ug/min via INTRAVENOUS

## 2019-05-20 MED ORDER — FENTANYL CITRATE (PF) 100 MCG/2ML IJ SOLN: 25.0000 ug | INTRAMUSCULAR | Status: DC | PRN

## 2019-05-20 MED ORDER — FAMOTIDINE 20 MG PO TABS
ORAL_TABLET | ORAL | Status: AC
  Filled 2019-05-20: qty 1

## 2019-05-20 MED ORDER — GLYCOPYRROLATE 0.2 MG/ML IJ SOLN
INTRAMUSCULAR | Status: AC
  Filled 2019-05-20: qty 1

## 2019-05-20 MED ORDER — LIDOCAINE HCL (PF) 2 % IJ SOLN
INTRAMUSCULAR | Status: AC
  Filled 2019-05-20: qty 5

## 2019-05-20 MED ORDER — DEXMEDETOMIDINE HCL IN NACL 80 MCG/20ML IV SOLN
INTRAVENOUS | Status: AC
  Filled 2019-05-20: qty 20

## 2019-05-20 MED ORDER — GLYCOPYRROLATE 0.2 MG/ML IJ SOLN
INTRAMUSCULAR | Status: DC | PRN
  Administered 2019-05-20 (×2): .1 mg via INTRAVENOUS

## 2019-05-20 MED ORDER — CHLORHEXIDINE GLUCONATE CLOTH 2 % EX PADS: 6.0000 | MEDICATED_PAD | Freq: Once | CUTANEOUS | Status: DC

## 2019-05-20 MED ORDER — DEXMEDETOMIDINE HCL 200 MCG/2ML IV SOLN
INTRAVENOUS | Status: DC | PRN
  Administered 2019-05-20 (×4): 4 ug via INTRAVENOUS

## 2019-05-20 MED ORDER — LACTATED RINGERS IV SOLN: INTRAVENOUS | Status: DC | PRN

## 2019-05-20 MED ORDER — SEVOFLURANE IN SOLN
RESPIRATORY_TRACT | Status: AC
  Filled 2019-05-20: qty 250

## 2019-05-20 MED ORDER — SUCCINYLCHOLINE CHLORIDE 20 MG/ML IJ SOLN
INTRAMUSCULAR | Status: DC | PRN
  Administered 2019-05-20: 140 mg via INTRAVENOUS

## 2019-05-20 MED ORDER — HYDROCODONE-ACETAMINOPHEN 5-325 MG PO TABS: 1.0000 | ORAL_TABLET | Freq: Four times a day (QID) | ORAL | 0 refills | Status: AC | PRN

## 2019-05-20 MED ORDER — PROPOFOL 10 MG/ML IV BOLUS
INTRAVENOUS | Status: DC | PRN
  Administered 2019-05-20: 140 mg via INTRAVENOUS

## 2019-05-20 MED ORDER — OSCAL 500/200 D-3 500-200 MG-UNIT PO TABS: 1.0000 | ORAL_TABLET | Freq: Three times a day (TID) | ORAL | 0 refills | Status: AC

## 2019-05-20 MED ORDER — OXYCODONE HCL 5 MG/5ML PO SOLN: 5.0000 mg | Freq: Once | ORAL | Status: AC | PRN

## 2019-05-20 MED ORDER — MIDAZOLAM HCL 2 MG/2ML IJ SOLN
INTRAMUSCULAR | Status: DC | PRN
  Administered 2019-05-20: 2 mg via INTRAVENOUS

## 2019-05-20 MED ORDER — OXYCODONE HCL 5 MG PO TABS: 5.0000 mg | ORAL_TABLET | Freq: Once | ORAL | Status: AC | PRN

## 2019-05-20 MED ORDER — CELECOXIB 200 MG PO CAPS
ORAL_CAPSULE | ORAL | Status: AC
  Filled 2019-05-20: qty 1

## 2019-05-20 MED ORDER — ACETAMINOPHEN 500 MG PO TABS
1000.0000 mg | ORAL_TABLET | ORAL | Status: AC
  Administered 2019-05-20: 1000 mg via ORAL

## 2019-05-20 MED ORDER — EPHEDRINE SULFATE 50 MG/ML IJ SOLN
INTRAMUSCULAR | Status: DC | PRN
  Administered 2019-05-20: 10 mg via INTRAVENOUS

## 2019-05-20 MED ORDER — PHENYLEPHRINE HCL (PRESSORS) 10 MG/ML IV SOLN
INTRAVENOUS | Status: AC
  Filled 2019-05-20: qty 1

## 2019-05-20 SURGICAL SUPPLY — 48 items
BACTOSHIELD CHG 4% 4OZ (MISCELLANEOUS) ×1
BASIN GRAD PLASTIC 32OZ STRL (MISCELLANEOUS) ×2 IMPLANT
BLADE SURG 15 STRL LF DISP TIS (BLADE) ×1 IMPLANT
BLADE SURG 15 STRL SS (BLADE) ×1
CANISTER SUCT 1200ML W/VALVE (MISCELLANEOUS) ×2 IMPLANT
CLIP VESOCCLUDE SM WIDE 6/CT (CLIP) ×2 IMPLANT
COVER WAND RF STERILE (DRAPES) ×2 IMPLANT
DERMABOND ADVANCED (GAUZE/BANDAGES/DRESSINGS) ×1
DERMABOND ADVANCED .7 DNX12 (GAUZE/BANDAGES/DRESSINGS) ×1 IMPLANT
DRAPE MAG INST 16X20 L/F (DRAPES) ×2 IMPLANT
DRAPE THYROID T SHEET (DRAPES) ×2 IMPLANT
ELECT CAUTERY BLADE TIP 2.5 (TIP) ×2
ELECT LARYNGEAL DUAL CHAN (ELECTRODE) IMPLANT
ELECT NEEDLE 20X.3 GREEN (MISCELLANEOUS)
ELECT REM PT RETURN 9FT ADLT (ELECTROSURGICAL) ×2
ELECTRODE CAUTERY BLDE TIP 2.5 (TIP) ×1 IMPLANT
ELECTRODE NEEDLE 20X.3 GREEN (MISCELLANEOUS) IMPLANT
ELECTRODE REM PT RTRN 9FT ADLT (ELECTROSURGICAL) ×1 IMPLANT
GAUZE 4X4 16PLY RFD (DISPOSABLE) IMPLANT
GLOVE BIO SURGEON STRL SZ 6.5 (GLOVE) ×10 IMPLANT
GLOVE INDICATOR 7.0 STRL GRN (GLOVE) ×10 IMPLANT
GOWN STRL REUS W/ TWL LRG LVL3 (GOWN DISPOSABLE) ×5 IMPLANT
GOWN STRL REUS W/TWL LRG LVL3 (GOWN DISPOSABLE) ×5
KIT TURNOVER KIT A (KITS) ×2 IMPLANT
LABEL OR SOLS (LABEL) ×2 IMPLANT
NDL SAFETY ECLIPSE 18X1.5 (NEEDLE) ×4 IMPLANT
NEEDLE HYPO 18GX1.5 SHARP (NEEDLE) ×4
NEEDLE HYPO 25X1 1.5 SAFETY (NEEDLE) ×8 IMPLANT
NERVE STIMULATOR WAVEFORM 16S (NEUROSURGERY SUPPLIES)
NS IRRIG 500ML POUR BTL (IV SOLUTION) ×2 IMPLANT
PACK BASIN MINOR (MISCELLANEOUS) ×2 IMPLANT
PROBE NEUROSIGN BIPOL (MISCELLANEOUS) IMPLANT
PROBE NEUROSIGN BIPOLAR (MISCELLANEOUS)
SCRUB CHG 4% DYNA-HEX 4OZ (MISCELLANEOUS) ×1 IMPLANT
SHEARS HARMONIC 9CM CVD (BLADE) ×2 IMPLANT
SPONGE INTESTINAL PEANUT (DISPOSABLE) ×2 IMPLANT
SPONGE KITTNER 5P (MISCELLANEOUS) ×2 IMPLANT
STIMULATOR NERVE WAVEFORM 16S (NEUROSURGERY SUPPLIES) IMPLANT
STRIP CLOSURE SKIN 1/2X4 (GAUZE/BANDAGES/DRESSINGS) ×2 IMPLANT
SURGICEL SNOW 2X4 (HEMOSTASIS) ×2 IMPLANT
SUT MNCRL AB 4-0 PS2 18 (SUTURE) IMPLANT
SUT PROLENE 4 0 PS 2 18 (SUTURE) ×2 IMPLANT
SUT SILK 2 0 (SUTURE) ×1
SUT SILK 2-0 18XBRD TIE 12 (SUTURE) ×1 IMPLANT
SUT VIC AB 4-0 RB1 27 (SUTURE) ×1
SUT VIC AB 4-0 RB1 27X BRD (SUTURE) ×1 IMPLANT
SYR 3ML LL SCALE MARK (SYRINGE) ×8 IMPLANT
SYR BULB IRRIG 60ML STRL (SYRINGE) ×2 IMPLANT

## 2019-05-20 NOTE — Transfer of Care (Signed)
Immediate Anesthesia Transfer of Care Note  Patient: Mariah Proctor  Procedure(s) Performed: PARATHYROIDECTOMY (N/A )  Patient Location: PACU  Anesthesia Type:General  Level of Consciousness: sedated  Airway & Oxygen Therapy: Patient Spontanous Breathing and Patient connected to face mask oxygen  Post-op Assessment: Report given to RN and Post -op Vital signs reviewed and stable  Post vital signs: Reviewed and stable  Last Vitals:  Vitals Value Taken Time  BP 122/65 05/20/19 1110  Temp 35.9 C 05/20/19 1110  Pulse 77 05/20/19 1112  Resp 17 05/20/19 1112  SpO2 100 % 05/20/19 1112  Vitals shown include unvalidated device data.  Last Pain:  Vitals:   05/20/19 0822  TempSrc: Temporal  PainSc: 0-No pain         Complications: No apparent complications

## 2019-05-20 NOTE — Anesthesia Procedure Notes (Addendum)
Procedure Name: Intubation Performed by: Cory Munch, RN Pre-anesthesia Checklist: Patient identified, Emergency Drugs available, Suction available and Patient being monitored Patient Re-evaluated:Patient Re-evaluated prior to induction Oxygen Delivery Method: Circle system utilized Preoxygenation: Pre-oxygenation with 100% oxygen Induction Type: IV induction Ventilation: Mask ventilation without difficulty and Oral airway inserted - appropriate to patient size Laryngoscope Size: McGraph and 3 Grade View: Grade I Tube type: Oral Tube size: 7.0 mm Number of attempts: 1 Airway Equipment and Method: Stylet and Oral airway Placement Confirmation: ETT inserted through vocal cords under direct vision,  positive ETCO2 and breath sounds checked- equal and bilateral Secured at: 21 cm Tube secured with: Tape Dental Injury: Teeth and Oropharynx as per pre-operative assessment

## 2019-05-20 NOTE — Anesthesia Preprocedure Evaluation (Addendum)
Anesthesia Evaluation  Patient identified by MRN, date of birth, ID band Patient awake    Reviewed: Allergy & Precautions, H&P , NPO status , Patient's Chart, lab work & pertinent test results  Airway Mallampati: III  TM Distance: >3 FB Neck ROM: full    Dental  (+) Teeth Intact   Pulmonary asthma , Not current smoker, former smoker,    breath sounds clear to auscultation       Cardiovascular hypertension,  Rhythm:regular Rate:Normal     Neuro/Psych Anxiety negative neurological ROS     GI/Hepatic negative GI ROS, Neg liver ROS,   Endo/Other  diabetesMorbid obesity  Renal/GU      Musculoskeletal   Abdominal   Peds  Hematology negative hematology ROS (+)   Anesthesia Other Findings Past Medical History: No date: Anemia     Comment:  many years ago No date: Anxiety No date: Arthritis No date: Asthma     Comment:  cough No date: Diabetes mellitus without complication ([facility]) No date: Hyperlipidemia No date: Hypertension  Past Surgical History: No date: ABDOMINAL HYSTERECTOMY No date: BACK SURGERY     Comment:  lumbar No date: ECTOPIC PREGNANCY SURGERY No date: TUBAL LIGATION     Reproductive/Obstetrics negative OB ROS                            Anesthesia Physical Anesthesia Plan  ASA: III  Anesthesia Plan: General ETT   Post-op Pain Management:    Induction:   PONV Risk Score and Plan: Ondansetron, Dexamethasone and Treatment may vary due to age or medical condition  Airway Management Planned:   Additional Equipment:   Intra-op Plan:   Post-operative Plan:   Informed Consent: I have reviewed the patients History and Physical, chart, labs and discussed the procedure including the risks, benefits and alternatives for the proposed anesthesia with the patient or authorized representative who has indicated his/her understanding and acceptance.     Dental Advisory  Given  Plan Discussed with: Anesthesiologist  Anesthesia Plan Comments:        Anesthesia Quick Evaluation

## 2019-05-20 NOTE — Discharge Instructions (Addendum)
Post-operative Home Care After Thyroid or Parathyroid Surgery  What should I expect after my operation?   Mariah Proctor You will see swelling under the incision and/or bruising under it in a few days. This is usually greatest on the second or third day after the operation. You may also feel the sensation of swelling and/or of firmness that can last for a month or more.    . Neck incisions heal rapidly, within a week or two. You can get them damp after about 24 hours, however do not submerge the incision or allow it to become soaked or saturated with water or sweat for 2 weeks.   . Your scar will be most visible 1-2 months after the operation and gradually fade over the next 6-8 months. As it heals, a scar looks more pink or red than the skin around it.   Mariah Proctor may feel a firm 'healing ridge' directly under the incision. This is normal and will soften and go away when healing is complete within 3-6 months.   . All incisions are sensitive to sunlight.  For one year after surgery, you should use sunscreen when outdoors for long periods to prevent darkening of the scar area.    . We recommend that you not expose the incision to the ultraviolet lights used in tanning booths.    Will my neck hurt?  . Most patients experience very little pain, but you may feel some neck stiffness/soreness in your shoulder, back or neck and tension headache that takes a few days or weeks to go away completely.    . Some patients also notice minor changes in swallowing, which improve over time.   . The skin just above and below your incision will feel numb. This will improve over several months, but some persons may have a longterm decrease in sensation.   .  You may apply cold pack over your incision to improve the pain.    How will I manage my pain at home?  . Take NSAIDS like ibuprofen (Motrin, Advil), naproxen (Naprosyn, Aleve) or acetaminophen (Tylenol) every 6 hours for the first 3-5 days after operation. This will  minimize the pain you feel.      ? To prevent Tylenol overdose, do not take a Tylenol doses at the same time as a combination narcotic dose that contains Tylenol, like Vicodin and Percocet. However, You may take them 4-6 hours apart.     . If you have sore/stiff muscles in your back, shoulder or neck, you may use moist warm heat or heating pad on the affected areas 15-20 minutes at a time several times a day.   . Gently massaging your neck muscles will improve the neck stiffness.    . Do not be afraid to move your neck. Gently flexing and stretching your neck muscles will prevent stiffness.    . Stronger pain medication or narcotic (like Vicodin, Percocet) for severe pain is rarely needed. If it is, however, DO NOT drive a car or drink alcohol while taking these medications.    . Narcotics also cause constipation. Stool softeners (Colace) and fiber (fruits, bran, vegetables) and extra fluid intake helps. A stimulant laxative (Milk of Magnesia, Senokot) may be needed as well.    Will my voice be affected?  Your voice may be hoarse or weak at first, because the surgery took place near the voice box, but usually recovers within weeks. Some patients also notice a change in the pitch of their voices that affects  singing. Rarely, these changes can be permanent.   Are there any diet restrictions?  No, return to your previous diet and always eat a well balanced diet, low in fat, etc.    How will I care for my incision?  . If you have paper "steri strips" on your incision, leave them in place until they begin to fall off naturally. If they become discolored or messy, you may remove them 7-10 days after your operation.   . If you have a skin glue (Dermabond) closure, you may notice tiny pieces of yellow material on your washcloth. Any sutures (stitches) you may have are dissolvable and do not need to be removed.  . You may shower then gently pat dry your incision.   . Do not apply ointments or  powders.   . Avoid using Vitamin E cream or other moisturizers on the incision until after your first follow-up visit.   What new medications might I take home?   ? Calcium supplement:  Your body's calcium levels may fall after a total thyroidectomy or parathyroid operation. We recommend you purchase Os-Cal 500 (one tablet equals 500 mg of elemental calcium) or Citracal (2 tablets equal 630 mg of elemental calcium; the "Petites" version contains 500 mg elemental calcium per 2 tablets). You may be taking 3-6 (or more) tablets per day, depending on your doctor's recommendation. You will need to take calcium at different times to avoid medication interaction. Ask your pharmacists, nurse, or doctor about specific interactions.   ? Thyroid Hormone:  If you have had a thyroid operation, you may be prescribed thyroid hormone replacement, called levothyroxine (Synthroid, Levothroid, etc.). A blood test will be done in 6-8 weeks to ensure the dosage is correct  by your doctor or your surgeon.   ? Vitamin D:  If your doctor has prescribed a Vitamin D supplement, like Calcitriol (Rocaltrol), try to get it filled at the hospital pharmacy before you leave.  Sometimes regular pharmacies do not stock it and will need to order it in.  When can I go back to normal activities?  Mariah Proctor You may return to work in 5-7 days or sooner if desired. Contact the clinic coordinator if you need employer forms completed.   . You may drive as long as you are not taking any narcotics and your neck stiffness is resolved    Can I resume my previous medications?   . Yes, unless directed not to by your doctor.   . Before discharge, be sure to review your previous medications with your doctor or inpatient medical team.    When do I call for advice?   - If your temperature > 101.5  - You have trouble talking or breathing  - Your fingers/ hands or face or around your lips becomes numb and tingling. (This may mean your calcium level is  low.)  - You have trouble swallowing  - Your incision becomes swollen, red or drainage occurs.       Please start taking 1 tablets of either Os-Cal 500 or Citrical 3 times daily. You may use Tums, instead, but please be sure to get the "ultra" variety.  Calcium can be quite constipating, so be sure to drink at least 64 oz of water or other non-caffeinated beverage daily. You can still drink caffeine in addition to this.  If necessary, you may use over the counter stool softeners or fiber supplements to help with constipation.    AMBULATORY SURGERY  DISCHARGE INSTRUCTIONS  1) The drugs that you were given will stay in your system until tomorrow so for the next 24 hours you should not:  A) Drive an automobile B) Make any legal decisions C) Drink any alcoholic beverage   2) You may resume regular meals tomorrow.  Today it is better to start with liquids and gradually work up to solid foods.  You may eat anything you prefer, but it is better to start with liquids, then soup and crackers, and gradually work up to solid foods.   3) Please notify your doctor immediately if you have any unusual bleeding, trouble breathing, redness and pain at the surgery site, drainage, fever, or pain not relieved by medication.    4) Additional Instructions:        Please contact your physician with any problems or Same Day Surgery at [phone], Monday through Friday 6 am to 4 pm, or [place] at [facility] number at [phone].

## 2019-05-20 NOTE — Interval H&P Note (Signed)
History and Physical Interval Note:  05/20/2019 9:06 AM  Mariah Proctor  has presented today for surgery, with the diagnosis of Primary hyperparathyroidism.  The various methods of treatment have been discussed with the patient and family. After consideration of risks, benefits and other options for treatment, the patient has consented to  Procedure(s): PARATHYROIDECTOMY (N/A) as a surgical intervention.  The patient's history has been reviewed, patient examined, no change in status, stable for surgery.  I have reviewed the patient's chart and labs.  Questions were answered to the patient's satisfaction.     Fredirick Maudlin

## 2019-05-20 NOTE — Op Note (Signed)
Operative Note  Preoperative Diagnosis:  primary hyperparathyroidism  Postoperative Diagnosis:  primary hyperparathyroidism  Operation:  focused/minimally invasive parathyroidectomy with intraoperative PTH monitoring  Surgeon: Fredirick Maudlin, MD  Assistant: Floyce Stakes, RNFA   Anesthesia: GETA  Findings: Enlarged right superior parathyroid gland, descended within the tracheoesophageal groove.  Enlarged multinodular thyroid.  Appropriate drop in intraopPTH.  Indications: This is a 69 year old woman with a longstanding history of hypercalcemia.  Biochemical evaluation was most consistent with primary hyperparathyroidism.  Preoperative imaging suggested a right-sided parathyroid adenoma.  The risks of the operation were discussed with the patient and she agreed to proceed.  Procedure In Detail: The patient was identified in the preoperative holding area and brought to the operating room where she was placed supine on the OR table.  All bony prominences were padded and bilateral sequential compression devices were placed on the lower extremities.  General endotracheal anesthesia was induced without incident.  She was then positioned appropriately for the operation and sterilely prepped and draped in standard fashion.  A timeout was performed confirming the patient's identity, the procedure being performed, her allergies, all necessary equipment was available, and that maintenance anesthesia was adequate.  A 5 cm transverse incision was made midway between the sternal notch and thyroid cartilage.  This was carried down through the subcutaneous tissues and platysma using electrocautery.  Subplatysmal flaps were elevated and the strap muscles were divided in the median raphae.  We elevated the sternohyoid off of the sternothyroid on the right and expose the internal jugular vein.  A baseline PTH level was drawn and sent to the lab.  The strap muscles were then elevated off the thyroid tissue.  The  patient had multiple large nodules that made exposure and manipulation of the thyroid tissue somewhat difficult.  Ultimately, I was able to dissect behind the large nodule encompassing the right lobe of the thyroid and identified the abnormal parathyroid gland.  It was carefully dissected away from its surrounding tissues and elevated on its vascular pedicle which was then divided.  The gland was handed off as a specimen.  At the time of excision, I drew a PTH level.  5 and 10 minutes post excision, additional PTH levels were drawn and sent to the lab.  The wound bed was irrigated.  A Valsalva maneuver from the anesthesia team confirmed no ongoing surgical bleeding.  We applied SNoW to the wound bed as well as the venopuncture site for additional hemostasis.  The strap muscles were then closed in the midline with running 4-0 Vicryl.  The platysma was closed with interrupted Vicryl.  The skin was closed with running subcuticular Prolene.  We awaited the results of her PTH levels and these returned demonstrating a greater than 50% drop.  The skin was cleaned.  Dermabond and Steri-Strips were applied.  The Prolene suture was removed.  The patient was awakened, extubated, and taken to the postanesthesia care unit in good condition.  EBL: Less than 5 cc  IVF: See anesthesia record  Specimen(s): Right superior parathyroid gland to pathology for permanent evaluation  Complications: none immediately apparent.   Counts: all needles, instruments, and sponges were counted and reported to be correct in number at the end of the case.   I was present for and participated in the entire operation.  Fredirick Maudlin 11:13 AM

## 2019-05-20 NOTE — OR Nursing (Signed)
May resume aspirin tomorrow 05/21/19 per Dr. Celine Ahr, secure chat.  Added to discharge instructions, medication section.

## 2019-05-21 NOTE — Anesthesia Postprocedure Evaluation (Signed)
Anesthesia Post Note  Patient: Mariah Proctor  Procedure(s) Performed: PARATHYROIDECTOMY (N/A )  Patient location during evaluation: PACU Anesthesia Type: General Level of consciousness: awake and alert Pain management: pain level controlled Vital Signs Assessment: post-procedure vital signs reviewed and stable Respiratory status: spontaneous breathing, nonlabored ventilation and respiratory function stable Cardiovascular status: blood pressure returned to baseline and stable Postop Assessment: no apparent nausea or vomiting Anesthetic complications: no     Last Vitals:  Vitals:   05/20/19 1237 05/20/19 1320  BP: (!) 121/51 (!) 121/56  Pulse: 69 78  Resp: 16 18  Temp: (!) 36.3 C (!) 36.1 C  SpO2:      Last Pain:  Vitals:   05/20/19 1320  TempSrc: Temporal  PainSc: [place]

## 2019-05-23 ENCOUNTER — Telehealth: Payer: Self-pay | Admitting: General Surgery

## 2019-05-23 LAB — SURGICAL PATHOLOGY

## 2019-05-23 NOTE — Telephone Encounter (Signed)
Patient had Parathyroid surgery on 05/20/19. She reports that she has felt tired and fatigued since surgery. She has been taking her Vitamin D with calcium supplement. She has been drinking a lot of water but not much else. She has been eating some. Advised patient that being tired after surgery is not uncommon. Advised to try adding in some sports drinks once a day and broth as well as making sure she gets added protein to help with healing. She denies any chills, fever, nausea, vomiting, or throat pain. She will call back if she gets worse.

## 2019-05-23 NOTE — Telephone Encounter (Signed)
Patient is calling said she just had surgery, but has some questions and is asking if one of the nurses would give her a call. Please call patient and advise.

## 2019-06-01 DIAGNOSIS — D509 Iron deficiency anemia, unspecified: Secondary | ICD-10-CM | POA: Diagnosis not present

## 2019-06-01 DIAGNOSIS — I10 Essential (primary) hypertension: Secondary | ICD-10-CM | POA: Diagnosis not present

## 2019-06-01 DIAGNOSIS — F419 Anxiety disorder, unspecified: Secondary | ICD-10-CM | POA: Diagnosis not present

## 2019-06-01 DIAGNOSIS — G629 Polyneuropathy, unspecified: Secondary | ICD-10-CM | POA: Diagnosis not present

## 2019-06-01 DIAGNOSIS — M7989 Other specified soft tissue disorders: Secondary | ICD-10-CM | POA: Diagnosis not present

## 2019-06-01 DIAGNOSIS — E119 Type 2 diabetes mellitus without complications: Secondary | ICD-10-CM | POA: Diagnosis not present

## 2019-06-01 DIAGNOSIS — E210 Primary hyperparathyroidism: Secondary | ICD-10-CM | POA: Diagnosis not present

## 2019-06-02 ENCOUNTER — Encounter: Payer: Self-pay | Admitting: General Surgery

## 2019-06-02 ENCOUNTER — Other Ambulatory Visit: Payer: Self-pay

## 2019-06-02 ENCOUNTER — Ambulatory Visit (INDEPENDENT_AMBULATORY_CARE_PROVIDER_SITE_OTHER): Payer: Self-pay | Admitting: General Surgery

## 2019-06-02 VITALS — BP 162/78 | HR 79 | Temp 98.1°F | Ht 66.0 in | Wt 248.0 lb

## 2019-06-02 DIAGNOSIS — E892 Postprocedural hypoparathyroidism: Secondary | ICD-10-CM | POA: Insufficient documentation

## 2019-06-02 DIAGNOSIS — Z9889 Other specified postprocedural states: Secondary | ICD-10-CM

## 2019-06-02 NOTE — Patient Instructions (Addendum)
You may stop taking your Calcium supplements.  The swelling will go down in a few weeks. May rub Vitamin-E oil or other emmolient agent in area 3-4 times a day to soften. You will need to use sunscreen for the next year on the area to minimize altered pigmentation of the site.  Follow-up with our office as needed.  Please call and ask to speak with a nurse if you develop questions or concerns.   Parathyroidectomy, Care After This sheet gives you information about how to care for yourself after your procedure. Your health care provider may also give you more specific instructions. If you have problems or questions, contact your health care provider. What can I expect after the procedure? After the procedure, it is common to have:  Mild pain in the neck or upper body, especially when swallowing.  A swollen neck.  A sore throat.  A weak or hoarse voice.  Slight tingling or numbness around your mouth, or in your fingers or toes. This may last for a day or two after surgery. This condition is caused by low levels of calcium. You may be given calcium supplements to treat it. Follow these instructions at home:  Medicines  Take over-the-counter and prescription medicines only as told by your health care provider.  Do not drive or use heavy machinery while taking prescription pain medicine.  Do not take medicines that contain aspirin and ibuprofen until your health care provider says that you can. These medicines can increase your risk of bleeding. Eating and drinking  Follow instructions from your health care provider about eating or drinking restrictions. You may need to have only liquids and soft foods for a day after the procedure.  To prevent or treat constipation while you are taking prescription pain medicine, your health care provider may recommend that you: ? Drink enough fluid to keep your urine pale yellow. ? Take over-the-counter or prescription medicines. ? Eat foods that  are high in fiber, such as fresh fruits and vegetables, whole grains, and beans. ? Limit foods that are high in fat and processed sugars, such as fried and sweet foods. Incision care  Follow instructions from your health care provider about how to take care of your incision. Make sure you: ? Wash your hands with soap and water before you change your bandage (dressing). If soap and water are not available, use hand sanitizer. ? Change your dressing as told by your health care provider. ? Leave stitches (sutures), skin glue, or adhesive strips in place. These skin closures may need to stay in place for 2 weeks or longer. If adhesive strip edges start to loosen and curl up, you may trim the loose edges. Do not remove adhesive strips completely unless your health care provider tells you to do that.  Check your incision area every day for signs of infection. Check for: ? Redness, swelling, or pain. ? Fluid or blood. ? Warmth. ? Pus or a bad smell.  Do not take baths, swim, or use a hot tub until your health care provider approves. Activity  For the first 10 days after the procedure or as instructed by your health care provider: ? Do not lift anything that is heavier than 10 lb (4.5 kg). ? Do not jog, swim, or do other strenuous exercises. ? Do not play contact sports.  Avoid sitting for a long time without moving. Get up to take short walks every 1-2 hours. This is important to improve blood flow and breathing.  Ask for help if you feel weak or unsteady.  Return to your normal activities as told by your health care provider. Ask your health care provider what activities are safe for you. General instructions  Do not use any products that contain nicotine or tobacco, such as cigarettes and e-cigarettes. These can delay healing after surgery. If you need help quitting, ask your health care provider.  Keep all follow-up visits as told by your health care provider. This is important. Your health  care provider needs to monitor the calcium level in your blood to make sure that it does not become low. Contact a health care provider if you:  Have a fever.  Have more redness, swelling, or pain around your incision area.  Have fluid or blood coming from your incision area.  Notice that your incision area feels warm to the touch.  Have pus or a bad smell coming from your incision area.  Have trouble talking.  Have nausea or vomiting for more than 2 days. Get help right away if you:  Have trouble breathing.  Have trouble swallowing.  Develop a rash.  Develop a cough that gets worse.  Notice that your speech changes, or you have hoarseness that gets worse.  Develop numbness, tingling, or muscle spasms in the arms, hands, feet, or face. Summary  For a day or two after the procedure, you may have tingling or numbness around your mouth, or in your fingers or toes. Temporary hoarseness may also occur.  Follow instructions from your health care provider about how to take care of your incision. Watch for signs of infection.  Keep all follow-up visits as told by your health care provider. This is important. Your health care provider needs to monitor the calcium level in your blood to make sure that it does not become low.  Get help right away if you develop difficulty breathing, or numbness, tingling, or muscle spasms in the arms, hands, feet, or face. This information is not intended to replace advice given to you by your health care provider. Make sure you discuss any questions you have with your health care provider. Document Revised: 01/02/2017 Document Reviewed: 11/25/2016 Elsevier Patient Education  2020 [employer].

## 2019-06-02 NOTE — Progress Notes (Signed)
Mariah Proctor is here today for a postoperative visit.  She is a 69 year old woman who underwent a single gland parathyroidectomy on May 20, 2019.  Her PTH dropped by over 50% and her final pathology was consistent with a parathyroid adenoma.  Since her operation, she says that she has been doing well.  Her voice has essentially returned to normal.  Swallowing is also normal.  She has been taking her calcium supplements as prescribed.  She denies any paresthesias muscle cramping or other symptoms of hypocalcemia.  Today's Vitals   06/02/19 1109  BP: (!) 162/78  Pulse: 79  Temp: 98.1 F (36.7 C)  SpO2: 96%  Weight: 248 lb (112.5 kg)  Height: 5\' 6"  (1.676 m)   Body mass index is 40.03 kg/m. Focused examination: Her Steri-Strips were still in place.  These were removed to reveal a well approximated transverse incision.  There is some normal and expected postoperative swelling present.  No healing ridge is palpated.  There is no erythema, induration, or drainage present.  Labs were drawn yesterday by her PCP.  These show a calcium of 10.2 which is a significant improvement from 12.7 in January and 11.0 in February.  Impression and plan: This is a 69 year old woman who underwent a successful single gland parathyroidectomy.  She is doing well.  She may discontinue her calcium supplementation.  She was instructed in scar care today, including massage with an emollient agent to minimize tethering and improve cosmesis, as well as protection from ultraviolet exposure for the next year to avoid altered pigmentation.  At this time, she does not appear to have any ongoing endocrine surgical needs.  I will see her on an as-needed basis.

## 2019-06-05 ENCOUNTER — Emergency Department: Payer: PPO

## 2019-06-05 ENCOUNTER — Other Ambulatory Visit: Payer: Self-pay

## 2019-06-05 ENCOUNTER — Emergency Department
Admission: EM | Admit: 2019-06-05 | Discharge: 2019-06-05 | Disposition: A | Discharge status: Home / Self Care | Payer: PPO | Attending: Emergency Medicine | Admitting: Emergency Medicine

## 2019-06-05 ENCOUNTER — Encounter: Payer: Self-pay | Admitting: Emergency Medicine

## 2019-06-05 DIAGNOSIS — Z7984 Long term (current) use of oral hypoglycemic drugs: Secondary | ICD-10-CM | POA: Diagnosis not present

## 2019-06-05 DIAGNOSIS — M79605 Pain in left leg: Secondary | ICD-10-CM

## 2019-06-05 DIAGNOSIS — R0789 Other chest pain: Secondary | ICD-10-CM | POA: Diagnosis present

## 2019-06-05 DIAGNOSIS — I10 Essential (primary) hypertension: Secondary | ICD-10-CM | POA: Diagnosis not present

## 2019-06-05 DIAGNOSIS — Z7982 Long term (current) use of aspirin: Secondary | ICD-10-CM | POA: Insufficient documentation

## 2019-06-05 DIAGNOSIS — J45909 Unspecified asthma, uncomplicated: Secondary | ICD-10-CM | POA: Diagnosis not present

## 2019-06-05 DIAGNOSIS — M79662 Pain in left lower leg: Secondary | ICD-10-CM | POA: Diagnosis not present

## 2019-06-05 DIAGNOSIS — E119 Type 2 diabetes mellitus without complications: Secondary | ICD-10-CM | POA: Insufficient documentation

## 2019-06-05 DIAGNOSIS — I4891 Unspecified atrial fibrillation: Secondary | ICD-10-CM

## 2019-06-05 DIAGNOSIS — Z79899 Other long term (current) drug therapy: Secondary | ICD-10-CM | POA: Insufficient documentation

## 2019-06-05 DIAGNOSIS — R079 Chest pain, unspecified: Secondary | ICD-10-CM | POA: Diagnosis not present

## 2019-06-05 LAB — BASIC METABOLIC PANEL
Anion gap: 7 (ref 5–15)
BUN: 11 mg/dL (ref 8–23)
CO2: 23 mmol/L (ref 22–32)
Calcium: 10.1 mg/dL (ref 8.9–10.3)
Chloride: 108 mmol/L (ref 98–111)
Creatinine, Ser: 0.67 mg/dL (ref 0.44–1.00)
GFR calc Af Amer: >60 mL/min (ref >60)
GFR calc non Af Amer: >60 mL/min (ref >60)
Glucose, Bld: 125 mg/dL — ABNORMAL HIGH (ref 70–99)
Potassium: 3.8 mmol/L (ref 3.5–5.1)
Sodium: 138 mmol/L (ref 135–145)

## 2019-06-05 LAB — CBC
HCT: 40.4 % (ref 36.0–46.0)
Hemoglobin: 13.5 g/dL (ref 12.0–15.0)
MCH: 29.7 pg (ref 26.0–34.0)
MCHC: 33.4 g/dL (ref 30.0–36.0)
MCV: 89 fL (ref 80.0–100.0)
Platelets: 328 10*3/uL (ref 150–400)
RBC: 4.54 MIL/uL (ref 3.87–5.11)
RDW: 12.9 % (ref 11.5–15.5)
WBC: 7.1 10*3/uL (ref 4.0–10.5)
nRBC: 0 % (ref 0.0–0.2)

## 2019-06-05 LAB — TROPONIN I (HIGH SENSITIVITY)
Troponin I (High Sensitivity): 10 ng/L (ref <18)
Troponin I (High Sensitivity): 11 ng/L (ref <18)

## 2019-06-05 LAB — FIBRIN DERIVATIVES D-DIMER (ARMC ONLY): Fibrin derivatives D-dimer (ARMC): 684.62 ng/mL (FEU) — ABNORMAL HIGH (ref 0.00–499.00)

## 2019-06-05 LAB — MAGNESIUM: Magnesium: 1.8 mg/dL (ref 1.7–2.4)

## 2019-06-05 LAB — TSH: TSH: 0.026 u[IU]/mL — ABNORMAL LOW (ref 0.350–4.500)

## 2019-06-05 LAB — PHOSPHORUS: Phosphorus: 3.4 mg/dL (ref 2.5–4.6)

## 2019-06-05 LAB — T4, FREE: Free T4: 1.27 ng/dL — ABNORMAL HIGH (ref 0.61–1.12)

## 2019-06-05 MED ORDER — SODIUM CHLORIDE 0.9 % IV BOLUS
1000.0000 mL | Freq: Once | INTRAVENOUS | Status: AC
  Administered 2019-06-05: 1000 mL via INTRAVENOUS

## 2019-06-05 MED ORDER — TRAMADOL HCL 50 MG PO TABS
50.0000 mg | ORAL_TABLET | Freq: Once | ORAL | Status: AC
  Administered 2019-06-05: 50 mg via ORAL
  Filled 2019-06-05: qty 1

## 2019-06-05 MED ORDER — METOPROLOL TARTRATE 25 MG PO TABS
25.0000 mg | ORAL_TABLET | Freq: Two times a day (BID) | ORAL | 0 refills | Status: AC
Start: 2019-06-05 — End: 2019-06-25

## 2019-06-05 MED ORDER — SODIUM CHLORIDE 0.9% FLUSH
3.0000 mL | Freq: Once | INTRAVENOUS | Status: AC
  Administered 2019-06-05: 3 mL via INTRAVENOUS

## 2019-06-05 MED ORDER — METOPROLOL TARTRATE 50 MG PO TABS
50.0000 mg | ORAL_TABLET | Freq: Once | ORAL | Status: AC
  Administered 2019-06-05: 50 mg via ORAL
  Filled 2019-06-05: qty 1

## 2019-06-05 MED ORDER — IOHEXOL 350 MG/ML SOLN
100.0000 mL | Freq: Once | INTRAVENOUS | Status: AC | PRN
  Administered 2019-06-05: 100 mL via INTRAVENOUS

## 2019-06-05 NOTE — ED Notes (Signed)
Pt CO increased pain in left leg, MD Joni Fears made aware

## 2019-06-05 NOTE — ED Notes (Signed)
Pt complaining of increase pain and tingling in both legs, states she believes she may be having allergic reaction to contrast used during CT scan. No rash or redness to either foot noted. Normal and equal sensation in feet bilaterally. Pt ambulated to bathroom with steady gait. MD Joni Fears made aware.

## 2019-06-05 NOTE — ED Provider Notes (Signed)
[facility] Emergency Department Provider Note  ____________________________________________  Time seen: Approximately 8:04 PM  I have reviewed the triage vital signs and the nursing notes.   HISTORY  Chief Complaint Dizziness, Numbness, and Chest Pain    HPI Mariah Proctor is a 69 y.o. female with a history of hypertension, diabetes, obesity and recent parathyroidectomy surgery under general anesthesia performed about 2 weeks ago who comes the ED complaining of tightness in the upper chest since yesterday, continuous, nonradiating, no aggravating or alleviating factors.  No shortness of breath, pain is not exertional nor pleuritic.  No vomiting or diaphoresis.  Reports that she feels lightheaded when she stands up.  She has been compliant with her medications, eating and drinking normally.   She also reports left calf pain since her surgery.   Past Medical History:  Diagnosis Date  . Anemia    many years ago  . Anxiety   . Arthritis   . Asthma    cough  . Diabetes mellitus without complication ([place])   . Hyperlipidemia   . Hypertension      Patient Active Problem List   Diagnosis Date Noted  . Status post parathyroidectomy 06/02/2019  . Primary hyperparathyroidism ([place]) 04/21/2019     Past Surgical History:  Procedure Laterality Date  . ABDOMINAL HYSTERECTOMY    . BACK SURGERY     lumbar  . ECTOPIC PREGNANCY SURGERY    . PARATHYROIDECTOMY N/A 05/20/2019   Procedure: PARATHYROIDECTOMY;  Surgeon: Fredirick Maudlin, MD;  Location: [facility] ORS;  Service: General;  Laterality: N/A;  . TUBAL LIGATION       Prior to Admission medications   Medication Sig Start Date End Date Taking? Authorizing Provider  albuterol (VENTOLIN HFA) 108 (90 Base) MCG/ACT inhaler Inhale 2 puffs into the lungs every 6 (six) hours as needed for wheezing or shortness of breath.  07/14/17   [provider]  ALPRAZolam Duanne Moron) 0.25 MG tablet Take 0.25 mg by mouth  daily as needed for anxiety. 04/26/19   [provider]  amLODipine (NORVASC) 10 MG tablet Take 10 mg by mouth daily.  03/11/19   [provider]  aspirin 81 MG EC tablet Take 81 mg by mouth daily.     [provider]  blood glucose meter kit and supplies by XX route as directed 04/13/19 04/12/20  [provider]  calcium-vitamin D (OSCAL 500/200 D-3) 500-200 MG-UNIT tablet Take 1 tablet by mouth 3 (three) times daily for 14 days. 05/20/19 06/03/19  Fredirick Maudlin, MD  cholecalciferol (VITAMIN D) 25 MCG (1000 UNIT) tablet Take 1,000 Units by mouth daily.     [provider]  fluticasone (FLONASE) 50 MCG/ACT nasal spray Place 2 sprays into both nostrils daily as needed for allergies.  06/11/18   [provider]  hydrALAZINE (APRESOLINE) 50 MG tablet Take 50 mg by mouth 3 (three) times daily.  04/06/19 04/05/20  [provider]  HYDROcodone-acetaminophen (NORCO/VICODIN) 5-325 MG tablet Take 1 tablet by mouth every 6 (six) hours as needed for moderate pain. 05/20/19   Fredirick Maudlin, MD  ibuprofen (ADVIL) 800 MG tablet Take 1 tablet (800 mg total) by mouth every 8 (eight) hours as needed. 05/20/19   Fredirick Maudlin, MD  metFORMIN (GLUCOPHAGE-XR) 500 MG 24 hr tablet Take 500 mg by mouth 2 (two) times daily. 04/15/19   [provider]  metoprolol tartrate (LOPRESSOR) 25 MG tablet Take 1 tablet (25 mg total) by mouth 2 (two) times daily for 20 days.  06/05/19 06/25/19  Carrie Mew, MD  pravastatin (PRAVACHOL) 40 MG tablet Take 40 mg by mouth daily.  02/16/19   [provider]  sertraline (ZOLOFT) 25 MG tablet Take 25 mg by mouth daily as needed (anxiety).  04/26/19   [provider]  telmisartan (MICARDIS) 80 MG tablet Take 80 mg by mouth daily.  03/02/19 03/01/20  [provider]     Allergies Patient has no known allergies.   Family History  Problem Relation Age of Onset  . Aneurysm Mother   . Hypertension  Mother   . Colon cancer Father     Social History Social History   Tobacco Use  . Smoking status: Former Smoker    Types: Cigarettes  . Smokeless tobacco: Never Used  . Tobacco comment: 45 years ago  Substance Use Topics  . Alcohol use: Never  . Drug use: Never    Review of Systems  Constitutional:   No fever or chills.  ENT:   No sore throat. No rhinorrhea. Cardiovascular:   Positive chest pain as above without syncope. Respiratory:   No dyspnea or cough. Gastrointestinal:   Negative for abdominal pain, vomiting and diarrhea.  Musculoskeletal:   Negative for focal pain or swelling All other systems reviewed and are negative except as documented above in ROS and HPI.  ____________________________________________   PHYSICAL EXAM:  VITAL SIGNS: ED Triage Vitals  Enc Vitals Group     BP 06/05/19 1520 (!) 132/55     Pulse Rate 06/05/19 1520 66     Resp 06/05/19 1520 16     Temp 06/05/19 1520 98.4 F (36.9 C)     Temp Source 06/05/19 1520 Oral     SpO2 06/05/19 1520 99 %     Weight 06/05/19 1522 246 lb (111.6 kg)     Height 06/05/19 1522 5' 5"  (1.651 m)     Head Circumference --      Peak Flow --      Pain Score 06/05/19 1522 5     Pain Loc --      Pain Edu? --      Excl. in [place]? --     Vital signs reviewed, nursing assessments reviewed.   Constitutional:   Alert and oriented. Non-toxic appearance. Eyes:   Conjunctivae are normal. EOMI. PERRL. ENT      Head:   Normocephalic and atraumatic.      Nose:   Wearing a mask.      Mouth/Throat:   Wearing a mask.      Neck:   No meningismus. Full ROM. Hematological/Lymphatic/Immunilogical:   No cervical lymphadenopathy. Cardiovascular:   Irregularly irregular rhythm, heart rate 80. Symmetric bilateral radial and DP pulses.  No murmurs. Cap refill less than 2 seconds. Respiratory:   Normal respiratory effort without tachypnea/retractions. Breath sounds are clear and equal bilaterally. No  wheezes/rales/rhonchi. Gastrointestinal:   Soft and nontender. Non distended. There is no CVA tenderness.  No rebound, rigidity, or guarding.  Musculoskeletal:   Normal range of motion in all extremities. No joint effusions.  Tenderness in left calf.  No edema. Neurologic:   Normal speech and language.  Motor grossly intact. No acute focal neurologic deficits are appreciated.  Skin:    Skin is warm, dry and intact. No rash noted.  No petechiae, purpura, or bullae.  ____________________________________________    LABS (pertinent positives/negatives) (all labs ordered are listed, but only abnormal results are displayed) Labs Reviewed  BASIC METABOLIC PANEL - Abnormal; Notable for the  following components:      Result Value   Glucose, Bld 125 (*)    All other components within normal limits  TSH - Abnormal; Notable for the following components:   TSH 0.026 (*)    All other components within normal limits  T4, FREE - Abnormal; Notable for the following components:   Free T4 1.27 (*)    All other components within normal limits  FIBRIN DERIVATIVES D-DIMER ([facility] ONLY) - Abnormal; Notable for the following components:   Fibrin derivatives D-dimer ([facility]) 684.62 (*)    All other components within normal limits  CBC  MAGNESIUM  PHOSPHORUS  TROPONIN I (HIGH SENSITIVITY)  TROPONIN I (HIGH SENSITIVITY)   ____________________________________________   EKG  Interpreted by me Underlying sinus rhythm with frequent PJCs.  Rate of 96.  Normal axis and intervals.  Poor R wave progression.  Normal ST segments and T waves.  1 PVC on the strip.  ____________________________________________    RADIOLOGY  DG Chest 2 View  Result Date: 06/05/2019 CLINICAL DATA:  Chest pain. EXAM: CHEST - 2 VIEW COMPARISON:  Radiograph 04/15/2019 FINDINGS: The cardiomediastinal contours are normal. The lungs are clear. Pulmonary vasculature is normal. No consolidation, pleural effusion, or pneumothorax. No  acute osseous abnormalities are seen. IMPRESSION: No acute chest findings. Electronically Signed   By: Keith Rake M.D.   On: 06/05/2019 18:19   CT Angio Chest PE W and/or Wo Contrast  Result Date: 06/05/2019 CLINICAL DATA:  Chest pain. Recent surgery. PE suspected, low/intermediate prob, positive D-dimer EXAM: CT ANGIOGRAPHY CHEST WITH CONTRAST TECHNIQUE: Multidetector CT imaging of the chest was performed using the standard protocol during bolus administration of intravenous contrast. Multiplanar CT image reconstructions and MIPs were obtained to evaluate the vascular anatomy. CONTRAST:  178m OMNIPAQUE IOHEXOL 350 MG/ML SOLN COMPARISON:  Radiograph earlier this day. FINDINGS: Cardiovascular: There are no filling defects within the pulmonary arteries to suggest pulmonary embolus. Evaluation is diagnostic to the segmental level. The subsegmental branches cannot be assessed given contrast bolus timing and soft tissue attenuation from habitus. Aortic atherosclerosis. No aortic dissection. Upper normal heart size. No pericardial effusion. Mediastinum/Nodes: No mediastinal or hilar adenopathy. Patulous distal esophagus without wall thickening. Heterogeneously enlarged thyroid gland with scattered calcifications. This has been documented on prior studies (ref: J Am Coll Radiol. 2015 Feb;12(2): 143-50).Thyroid ultrasound 03/17/2019 performed at an outside institution ([facility]). Lungs/Pleura: Mild compressive atelectasis in the medial right lung adjacent to anterior osteophytes. No confluent airspace disease. No pleural effusion. No evidence of pulmonary edema. There is a 3 mm subpleural nodule in the right upper lobe, series 6, image 31. No pulmonary mass. Trachea and mainstem bronchi are patent. Upper Abdomen: Suspected hepatic steatosis. No acute findings. Musculoskeletal: 12 mm nodule in the central right breast. Multilevel degenerative change in the spine. There are no acute or suspicious osseous abnormalities.  Review of the MIP images confirms the above findings. IMPRESSION: 1. No pulmonary embolus or acute intrathoracic abnormality. 2. Right upper lobe 3 mm pulmonary nodule. No follow-up needed if patient is low-risk. Non-contrast chest CT can be considered in 12 months if patient is high-risk. This recommendation follows the consensus statement: Guidelines for Management of Incidental Pulmonary Nodules Detected on CT Images: From the Fleischner Society 2017; Radiology 2017; 284:228-243. 3. A 12 mm nodule in the right breast is nonspecific. Recommend up-to-date mammography, no breast imaging documented since March of 2019. Aortic Atherosclerosis (ICD10-I70.0). Electronically Signed   By: MKeith RakeM.D.   On: 06/05/2019 21:47  US Venous Img Lower Unilateral Left  Result Date: 06/05/2019 CLINICAL DATA:  Leg pain.  Recent surgery. EXAM: LEFT LOWER EXTREMITY VENOUS DOPPLER ULTRASOUND TECHNIQUE: Gray-scale sonography with compression, as well as color and duplex ultrasound, were performed to evaluate the deep venous system(s) from the level of the common femoral vein through the popliteal and proximal calf veins. COMPARISON:  None. FINDINGS: VENOUS Normal compressibility of the common femoral, superficial femoral, and popliteal veins, as well as the visualized calf veins. Visualized portions of profunda femoral vein and great saphenous vein unremarkable. No filling defects to suggest DVT on grayscale or color Doppler imaging. Doppler waveforms show normal direction of venous flow, normal respiratory plasticity and response to augmentation. Limited views of the contralateral common femoral vein are unremarkable. OTHER None. Limitations: none IMPRESSION: Negative. Electronically Signed   By: Constance Holster M.D.   On: 06/05/2019 20:01    ____________________________________________   PROCEDURES .1-3 Lead EKG Interpretation Performed by: Carrie Mew, MD Authorized by: Carrie Mew, MD      Interpretation: abnormal     ECG rate:  82   ECG rate assessment: normal     Rhythm: sinus rhythm     Ectopy: PJCs     Conduction: normal      ____________________________________________  DIFFERENTIAL DIAGNOSIS   DVT, pulmonary embolism, non-STEMI, hyperthyroidism, GERD, dehydration  CLINICAL IMPRESSION / ASSESSMENT AND PLAN / ED COURSE  Medications ordered in the ED: Medications  metoprolol tartrate (LOPRESSOR) tablet 50 mg (has no administration in time range)  traMADol (ULTRAM) tablet 50 mg (has no administration in time range)  sodium chloride flush (NS) 0.9 % injection 3 mL (3 mLs Intravenous Given 06/05/19 1850)  sodium chloride 0.9 % bolus 1,000 mL (1,000 mLs Intravenous New Bag/Given 06/05/19 1850)  iohexol (OMNIPAQUE) 350 MG/ML injection 100 mL (100 mLs Intravenous Contrast Given 06/05/19 2044)    Pertinent labs & imaging results that were available during my care of the patient were reviewed by me and considered in my medical decision making (see chart for details).  Mariah Proctor was evaluated in Emergency Department on 06/05/2019 for the symptoms described in the history of present illness. She was evaluated in the context of the global COVID-19 pandemic, which necessitated consideration that the patient might be at risk for infection with the SARS-CoV-2 virus that causes COVID-19. Institutional protocols and algorithms that pertain to the evaluation of patients at risk for COVID-19 are in a state of rapid change based on information released by regulatory bodies including the CDC and federal and state organizations. These policies and algorithms were followed during the patient's care in the ED.   Patient presents with calf pain for the past 2 weeks since surgery as well as chest pain today.  Chest pain is atypical and not particularly concerning for any particular syndrome given the risk factors and recent surgery, will need ultrasound of left lower extremity to rule out DVT.   Will check D-dimer as well, and if ultrasound and D-dimer are both unremarkable, will not need CT angiogram to evaluate for PE.  Serial troponins are negative, EKG is nonischemic but does suggest frequent ectopy or new onset of atrial fibrillation.  ----------------------------------------- 8:11 PM on 06/05/2019 -----------------------------------------  Magnesium, TFTs reassuring.  Ultrasound left lower extremity negative for DVT.  D-dimer is elevated to 680.  Will proceed with CT angiogram of the chest.  ----------------------------------------- 10:38 PM on 06/05/2019 -----------------------------------------  CT angiogram unremarkable.  Patient having persistent dysrhythmia with underlying sinus rhythm with  frequent supraventricular ectopy, heart rate of about 100.  Symptoms are improved after IV fluids.  No orthostatic symptoms.  I rechecked blood pressure with her standing, was 160/90 without reflex tachycardia.  I offered hospitalization for further evaluation of her ongoing dysrhythmia, she feels comfortable going home with metoprolol and close follow-up.    I will give her tramadol for her leg pain.  Start metoprolol, follow-up with cardiology this week.      ____________________________________________   FINAL CLINICAL IMPRESSION(S) / ED DIAGNOSES    Final diagnoses:  New onset atrial fibrillation ([place])  Left leg pain     ED Discharge Orders         Ordered    metoprolol tartrate (LOPRESSOR) 25 MG tablet  2 times daily     06/05/19 2234          Portions of this note were generated with dragon dictation software. Dictation errors may occur despite best attempts at proofreading.   Carrie Mew, MD 06/05/19 2240

## 2019-06-05 NOTE — ED Notes (Signed)
Phlebotomy called for repeat troponin. Pt was hard stick for initial labs.

## 2019-06-05 NOTE — ED Triage Notes (Signed)
First nurse note- here for chest pain, leg tightness, and dizziness. Pulled next for EKG

## 2019-06-05 NOTE — Discharge Instructions (Addendum)
Your labs tests, ultrasound of the leg, and CT scan of the chest were all okay today.  Your heart appears to be in an abnormal rhythm called atrial fibrillation currently. Take metoprolol as prescribed and follow up with cardiology this week.  Continue taking all of your home medications as usual.

## 2019-06-05 NOTE — ED Triage Notes (Signed)
Pt to ED via POV c/o chest tightness since yesterday. Pt states that the pain had been constant. Pt also stating that she is having some dizziness and issues with being off balance, this has been going on for a few weeks. Pt had surgery a few weeks ago and states that issues with balance started then. Pt states that she is currently having some burning in her central chest. Pt is in NAD.

## 2019-06-05 NOTE — ED Notes (Signed)
Pt ambulated independently to restroom with steady gait, no assistance needed

## 2019-06-06 DIAGNOSIS — R42 Dizziness and giddiness: Secondary | ICD-10-CM | POA: Diagnosis not present

## 2019-06-06 DIAGNOSIS — F411 Generalized anxiety disorder: Secondary | ICD-10-CM | POA: Diagnosis not present

## 2019-06-06 DIAGNOSIS — I10 Essential (primary) hypertension: Secondary | ICD-10-CM | POA: Diagnosis not present

## 2019-06-06 DIAGNOSIS — E210 Primary hyperparathyroidism: Secondary | ICD-10-CM | POA: Diagnosis not present

## 2019-06-07 ENCOUNTER — Other Ambulatory Visit: Payer: Self-pay | Admitting: Internal Medicine

## 2019-06-07 DIAGNOSIS — R002 Palpitations: Secondary | ICD-10-CM | POA: Diagnosis not present

## 2019-06-07 DIAGNOSIS — I491 Atrial premature depolarization: Secondary | ICD-10-CM | POA: Diagnosis not present

## 2019-06-07 DIAGNOSIS — R0789 Other chest pain: Secondary | ICD-10-CM | POA: Diagnosis not present

## 2019-06-07 DIAGNOSIS — M7989 Other specified soft tissue disorders: Secondary | ICD-10-CM

## 2019-06-07 DIAGNOSIS — E7800 Pure hypercholesterolemia, unspecified: Secondary | ICD-10-CM | POA: Diagnosis not present

## 2019-06-07 DIAGNOSIS — I10 Essential (primary) hypertension: Secondary | ICD-10-CM | POA: Diagnosis not present

## 2019-06-07 DIAGNOSIS — R079 Chest pain, unspecified: Secondary | ICD-10-CM | POA: Diagnosis not present

## 2019-06-20 DIAGNOSIS — R079 Chest pain, unspecified: Secondary | ICD-10-CM | POA: Diagnosis not present

## 2019-06-21 DIAGNOSIS — I491 Atrial premature depolarization: Secondary | ICD-10-CM | POA: Diagnosis not present

## 2019-06-21 DIAGNOSIS — I10 Essential (primary) hypertension: Secondary | ICD-10-CM | POA: Diagnosis not present

## 2019-06-21 DIAGNOSIS — E7800 Pure hypercholesterolemia, unspecified: Secondary | ICD-10-CM | POA: Diagnosis not present

## 2019-10-03 DIAGNOSIS — I10 Essential (primary) hypertension: Secondary | ICD-10-CM | POA: Diagnosis not present

## 2019-10-03 DIAGNOSIS — F419 Anxiety disorder, unspecified: Secondary | ICD-10-CM | POA: Diagnosis not present

## 2019-10-03 DIAGNOSIS — E210 Primary hyperparathyroidism: Secondary | ICD-10-CM | POA: Diagnosis not present

## 2019-10-03 DIAGNOSIS — D509 Iron deficiency anemia, unspecified: Secondary | ICD-10-CM | POA: Diagnosis not present

## 2019-10-03 DIAGNOSIS — F329 Major depressive disorder, single episode, unspecified: Secondary | ICD-10-CM | POA: Diagnosis not present

## 2019-10-03 DIAGNOSIS — E119 Type 2 diabetes mellitus without complications: Secondary | ICD-10-CM | POA: Diagnosis not present

## 2019-10-31 DIAGNOSIS — R42 Dizziness and giddiness: Secondary | ICD-10-CM | POA: Diagnosis not present

## 2019-10-31 DIAGNOSIS — E1165 Type 2 diabetes mellitus with hyperglycemia: Secondary | ICD-10-CM | POA: Diagnosis not present

## 2019-10-31 DIAGNOSIS — F329 Major depressive disorder, single episode, unspecified: Secondary | ICD-10-CM | POA: Diagnosis not present

## 2019-10-31 DIAGNOSIS — F419 Anxiety disorder, unspecified: Secondary | ICD-10-CM | POA: Diagnosis not present

## 2019-10-31 DIAGNOSIS — I10 Essential (primary) hypertension: Secondary | ICD-10-CM | POA: Diagnosis not present

## 2019-10-31 DIAGNOSIS — E213 Hyperparathyroidism, unspecified: Secondary | ICD-10-CM | POA: Diagnosis not present

## 2019-10-31 DIAGNOSIS — R202 Paresthesia of skin: Secondary | ICD-10-CM | POA: Diagnosis not present

## 2019-11-07 DIAGNOSIS — I491 Atrial premature depolarization: Secondary | ICD-10-CM | POA: Diagnosis not present

## 2019-11-07 DIAGNOSIS — I10 Essential (primary) hypertension: Secondary | ICD-10-CM | POA: Diagnosis not present

## 2019-11-07 DIAGNOSIS — E7800 Pure hypercholesterolemia, unspecified: Secondary | ICD-10-CM | POA: Diagnosis not present

## 2019-11-09 DIAGNOSIS — Z20822 Contact with and (suspected) exposure to covid-19: Secondary | ICD-10-CM | POA: Diagnosis not present

## 2019-12-06 ENCOUNTER — Ambulatory Visit: Payer: PPO

## 2020-01-17 DIAGNOSIS — Z20822 Contact with and (suspected) exposure to covid-19: Secondary | ICD-10-CM | POA: Diagnosis not present

## 2020-02-07 DIAGNOSIS — Z20822 Contact with and (suspected) exposure to covid-19: Secondary | ICD-10-CM | POA: Diagnosis not present

## 2020-02-15 ENCOUNTER — Other Ambulatory Visit: Payer: Self-pay

## 2020-02-15 ENCOUNTER — Ambulatory Visit
Admission: RE | Admit: 2020-02-15 | Discharge: 2020-02-15 | Disposition: A | Discharge status: Home / Self Care | Payer: PPO | Source: Ambulatory Visit | Attending: Internal Medicine | Admitting: Internal Medicine

## 2020-02-15 DIAGNOSIS — Z1231 Encounter for screening mammogram for malignant neoplasm of breast: Secondary | ICD-10-CM | POA: Insufficient documentation

## 2020-03-08 DIAGNOSIS — Z03818 Encounter for observation for suspected exposure to other biological agents ruled out: Secondary | ICD-10-CM | POA: Diagnosis not present

## 2020-03-08 DIAGNOSIS — Z20822 Contact with and (suspected) exposure to covid-19: Secondary | ICD-10-CM | POA: Diagnosis not present

## 2020-03-27 ENCOUNTER — Other Ambulatory Visit: Payer: Self-pay

## 2020-03-27 ENCOUNTER — Ambulatory Visit
Admission: RE | Admit: 2020-03-27 | Discharge: 2020-03-27 | Disposition: A | Discharge status: Home / Self Care | Payer: PPO | Source: Ambulatory Visit | Attending: Internal Medicine | Admitting: Internal Medicine

## 2020-03-27 ENCOUNTER — Other Ambulatory Visit: Payer: Self-pay | Admitting: Internal Medicine

## 2020-03-27 DIAGNOSIS — R140 Abdominal distension (gaseous): Secondary | ICD-10-CM | POA: Insufficient documentation

## 2020-03-27 DIAGNOSIS — R1084 Generalized abdominal pain: Secondary | ICD-10-CM | POA: Diagnosis not present

## 2020-03-27 DIAGNOSIS — R1013 Epigastric pain: Secondary | ICD-10-CM | POA: Diagnosis not present

## 2020-03-27 DIAGNOSIS — I10 Essential (primary) hypertension: Secondary | ICD-10-CM | POA: Diagnosis not present

## 2020-03-27 DIAGNOSIS — K760 Fatty (change of) liver, not elsewhere classified: Secondary | ICD-10-CM | POA: Diagnosis not present

## 2020-03-27 DIAGNOSIS — E210 Primary hyperparathyroidism: Secondary | ICD-10-CM | POA: Diagnosis not present

## 2020-03-27 LAB — POCT I-STAT CREATININE: Creatinine, Ser: 0.9 mg/dL (ref 0.44–1.00)

## 2020-03-27 MED ORDER — IOHEXOL 300 MG/ML  SOLN
100.0000 mL | Freq: Once | INTRAMUSCULAR | Status: AC | PRN
  Administered 2020-03-27: 100 mL via INTRAVENOUS

## 2020-04-11 DIAGNOSIS — I10 Essential (primary) hypertension: Secondary | ICD-10-CM | POA: Diagnosis not present

## 2020-04-11 DIAGNOSIS — B9681 Helicobacter pylori [H. pylori] as the cause of diseases classified elsewhere: Secondary | ICD-10-CM | POA: Diagnosis not present

## 2020-04-11 DIAGNOSIS — R1013 Epigastric pain: Secondary | ICD-10-CM | POA: Diagnosis not present

## 2020-04-11 DIAGNOSIS — K2970 Gastritis, unspecified, without bleeding: Secondary | ICD-10-CM | POA: Diagnosis not present

## 2020-04-11 DIAGNOSIS — E119 Type 2 diabetes mellitus without complications: Secondary | ICD-10-CM | POA: Diagnosis not present

## 2020-04-11 DIAGNOSIS — R1312 Dysphagia, oropharyngeal phase: Secondary | ICD-10-CM | POA: Diagnosis not present

## 2020-04-24 DIAGNOSIS — E042 Nontoxic multinodular goiter: Secondary | ICD-10-CM | POA: Diagnosis not present

## 2020-04-24 DIAGNOSIS — E210 Primary hyperparathyroidism: Secondary | ICD-10-CM | POA: Diagnosis not present

## 2020-04-24 DIAGNOSIS — B9681 Helicobacter pylori [H. pylori] as the cause of diseases classified elsewhere: Secondary | ICD-10-CM | POA: Diagnosis not present

## 2020-04-24 DIAGNOSIS — I10 Essential (primary) hypertension: Secondary | ICD-10-CM | POA: Diagnosis not present

## 2020-04-24 DIAGNOSIS — E1165 Type 2 diabetes mellitus with hyperglycemia: Secondary | ICD-10-CM | POA: Diagnosis not present

## 2020-04-24 DIAGNOSIS — M5137 Other intervertebral disc degeneration, lumbosacral region: Secondary | ICD-10-CM | POA: Diagnosis not present

## 2020-04-24 DIAGNOSIS — Z0000 Encounter for general adult medical examination without abnormal findings: Secondary | ICD-10-CM | POA: Diagnosis not present

## 2020-04-24 DIAGNOSIS — K2970 Gastritis, unspecified, without bleeding: Secondary | ICD-10-CM | POA: Diagnosis not present

## 2020-07-03 DIAGNOSIS — Z20822 Contact with and (suspected) exposure to covid-19: Secondary | ICD-10-CM | POA: Diagnosis not present

## 2020-07-03 DIAGNOSIS — U071 COVID-19: Secondary | ICD-10-CM | POA: Diagnosis not present

## 2020-11-13 DIAGNOSIS — R300 Dysuria: Secondary | ICD-10-CM | POA: Diagnosis not present

## 2020-11-13 DIAGNOSIS — R195 Other fecal abnormalities: Secondary | ICD-10-CM | POA: Diagnosis not present

## 2020-11-13 DIAGNOSIS — R1030 Lower abdominal pain, unspecified: Secondary | ICD-10-CM | POA: Diagnosis not present

## 2020-11-13 DIAGNOSIS — R140 Abdominal distension (gaseous): Secondary | ICD-10-CM | POA: Diagnosis not present

## 2020-11-19 ENCOUNTER — Other Ambulatory Visit: Payer: Self-pay | Admitting: Internal Medicine

## 2020-11-19 ENCOUNTER — Other Ambulatory Visit (HOSPITAL_COMMUNITY): Payer: Self-pay | Admitting: Internal Medicine

## 2020-11-19 DIAGNOSIS — R140 Abdominal distension (gaseous): Secondary | ICD-10-CM

## 2020-11-19 DIAGNOSIS — R195 Other fecal abnormalities: Secondary | ICD-10-CM

## 2020-11-20 ENCOUNTER — Encounter: Payer: Self-pay | Admitting: General Surgery

## 2020-11-21 DIAGNOSIS — R944 Abnormal results of kidney function studies: Secondary | ICD-10-CM | POA: Diagnosis not present

## 2020-11-22 ENCOUNTER — Other Ambulatory Visit: Payer: Self-pay | Admitting: Internal Medicine

## 2020-11-22 ENCOUNTER — Ambulatory Visit
Admission: RE | Admit: 2020-11-22 | Discharge: 2020-11-22 | Disposition: A | Discharge status: Home / Self Care | Payer: PPO | Source: Ambulatory Visit | Attending: Internal Medicine | Admitting: Internal Medicine

## 2020-11-22 ENCOUNTER — Other Ambulatory Visit: Payer: Self-pay

## 2020-11-22 DIAGNOSIS — R140 Abdominal distension (gaseous): Secondary | ICD-10-CM

## 2020-11-22 DIAGNOSIS — R1011 Right upper quadrant pain: Secondary | ICD-10-CM | POA: Insufficient documentation

## 2020-11-22 DIAGNOSIS — R195 Other fecal abnormalities: Secondary | ICD-10-CM | POA: Insufficient documentation

## 2020-12-31 DIAGNOSIS — E119 Type 2 diabetes mellitus without complications: Secondary | ICD-10-CM | POA: Diagnosis not present

## 2021-01-03 DIAGNOSIS — E210 Primary hyperparathyroidism: Secondary | ICD-10-CM | POA: Diagnosis not present

## 2021-01-03 DIAGNOSIS — R1030 Lower abdominal pain, unspecified: Secondary | ICD-10-CM | POA: Diagnosis not present

## 2021-01-03 DIAGNOSIS — E1165 Type 2 diabetes mellitus with hyperglycemia: Secondary | ICD-10-CM | POA: Diagnosis not present

## 2021-01-03 DIAGNOSIS — I10 Essential (primary) hypertension: Secondary | ICD-10-CM | POA: Diagnosis not present

## 2021-01-03 DIAGNOSIS — M5137 Other intervertebral disc degeneration, lumbosacral region: Secondary | ICD-10-CM | POA: Diagnosis not present

## 2021-03-05 DIAGNOSIS — E1165 Type 2 diabetes mellitus with hyperglycemia: Secondary | ICD-10-CM | POA: Diagnosis not present

## 2021-03-05 DIAGNOSIS — M5442 Lumbago with sciatica, left side: Secondary | ICD-10-CM | POA: Diagnosis not present

## 2021-03-05 DIAGNOSIS — I10 Essential (primary) hypertension: Secondary | ICD-10-CM | POA: Diagnosis not present

## 2021-03-07 IMAGING — CR DG CHEST 2V
1 series · 2 of 2 positions shown · non-contrast
Comparison: None.

CLINICAL DATA: Chest tightness

EXAM:
CHEST - 2 VIEW

[Series 1: dg chest 2 view · 0.14mm/px · 2 of 2 slices shown]
[im 1/2]
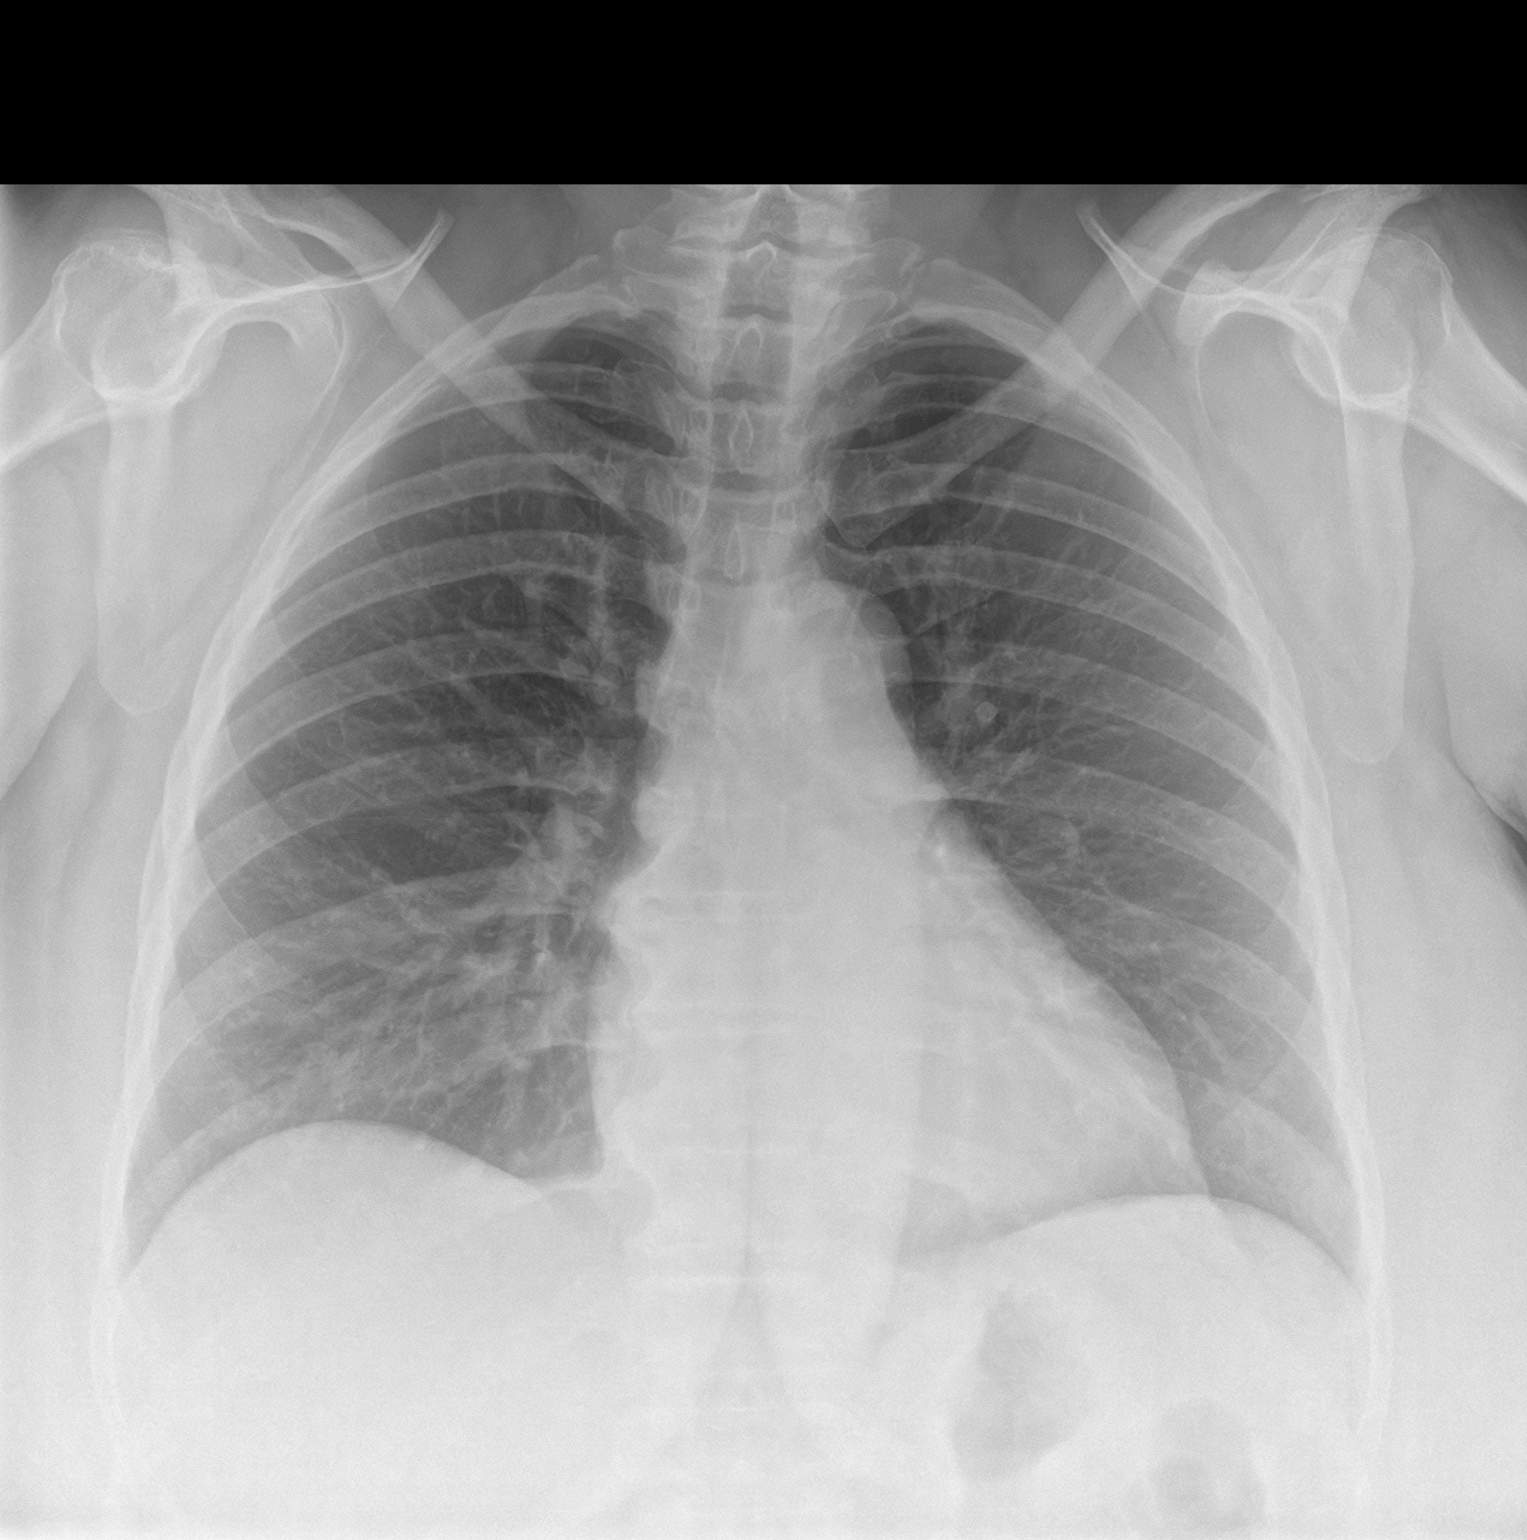
[im 2/2]
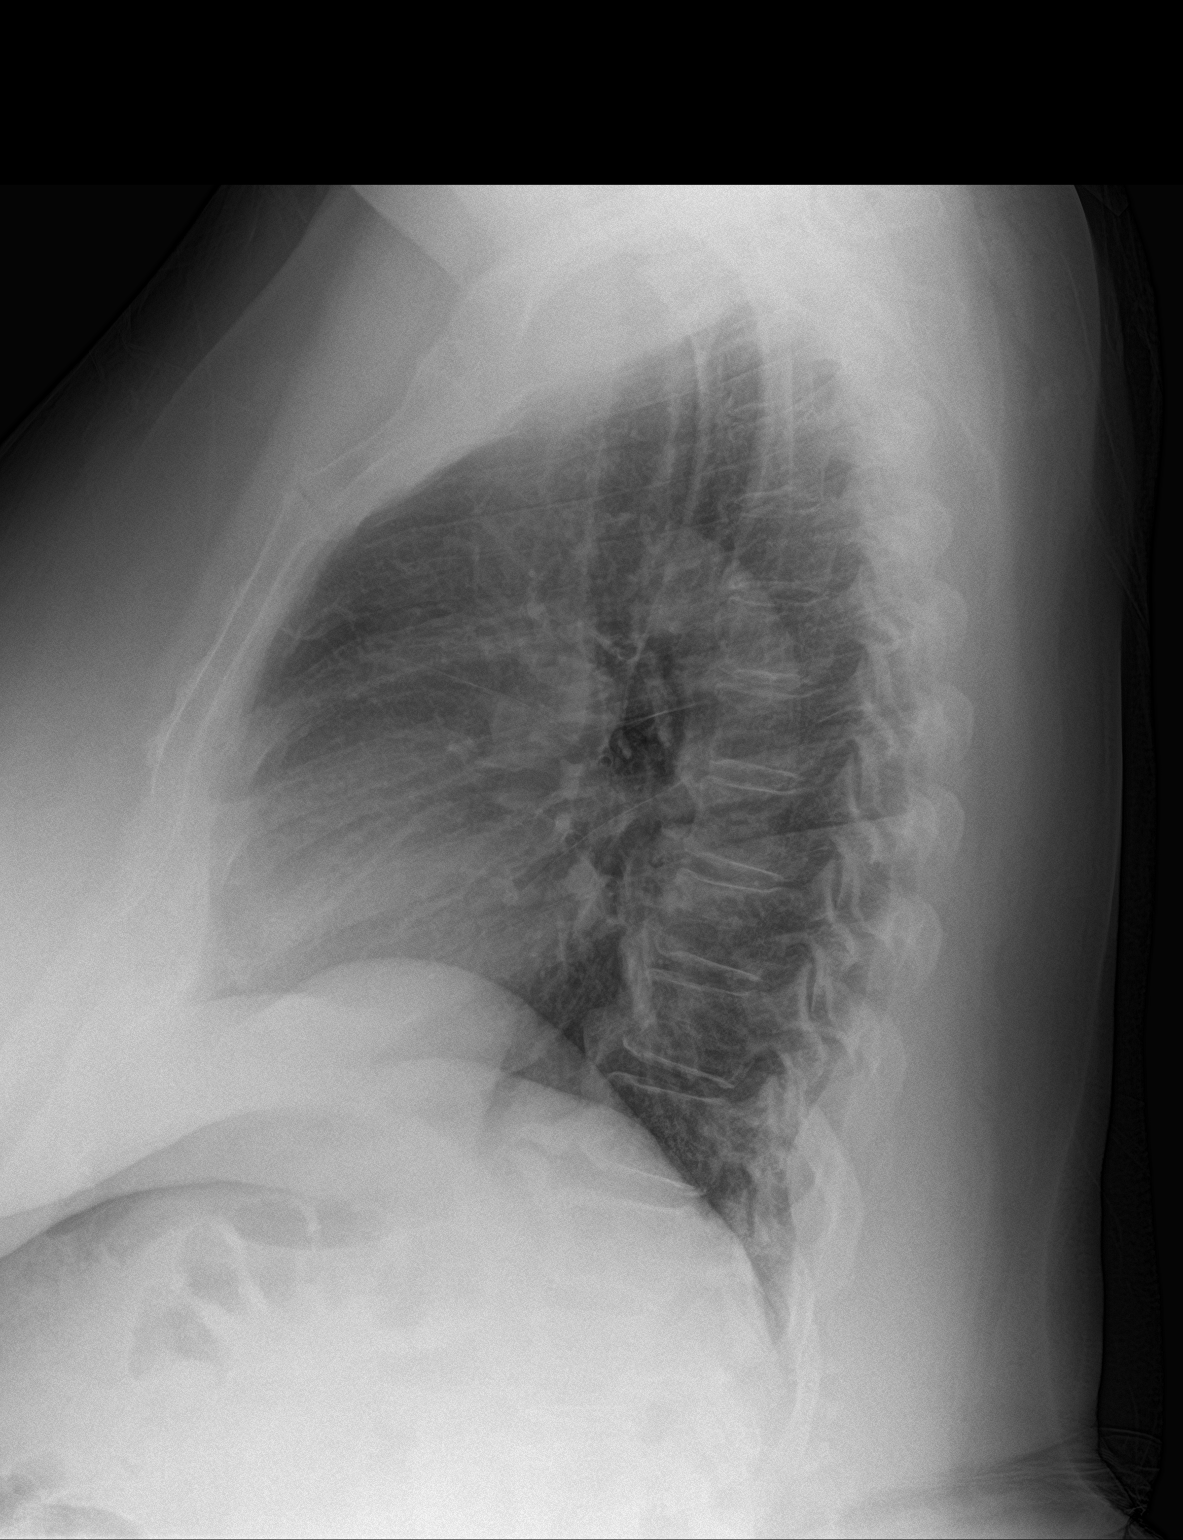

[2 of 2 positions shown; findings below may reference images not displayed]

FINDINGS: The heart size and mediastinal contours are within normal limits.
Both lungs are clear. The visualized skeletal structures show
degenerative changes of the thoracic spine.
IMPRESSION: No active cardiopulmonary disease.

## 2021-04-27 IMAGING — CR DG CHEST 2V
1 series · 2 of 2 positions shown · non-contrast
Comparison: Radiograph 04/15/2019

CLINICAL DATA: Chest pain.

EXAM:
CHEST - 2 VIEW

[Series 1: dg chest 2 view · 0.14mm/px · 2 of 2 slices shown]
[im 1/2]
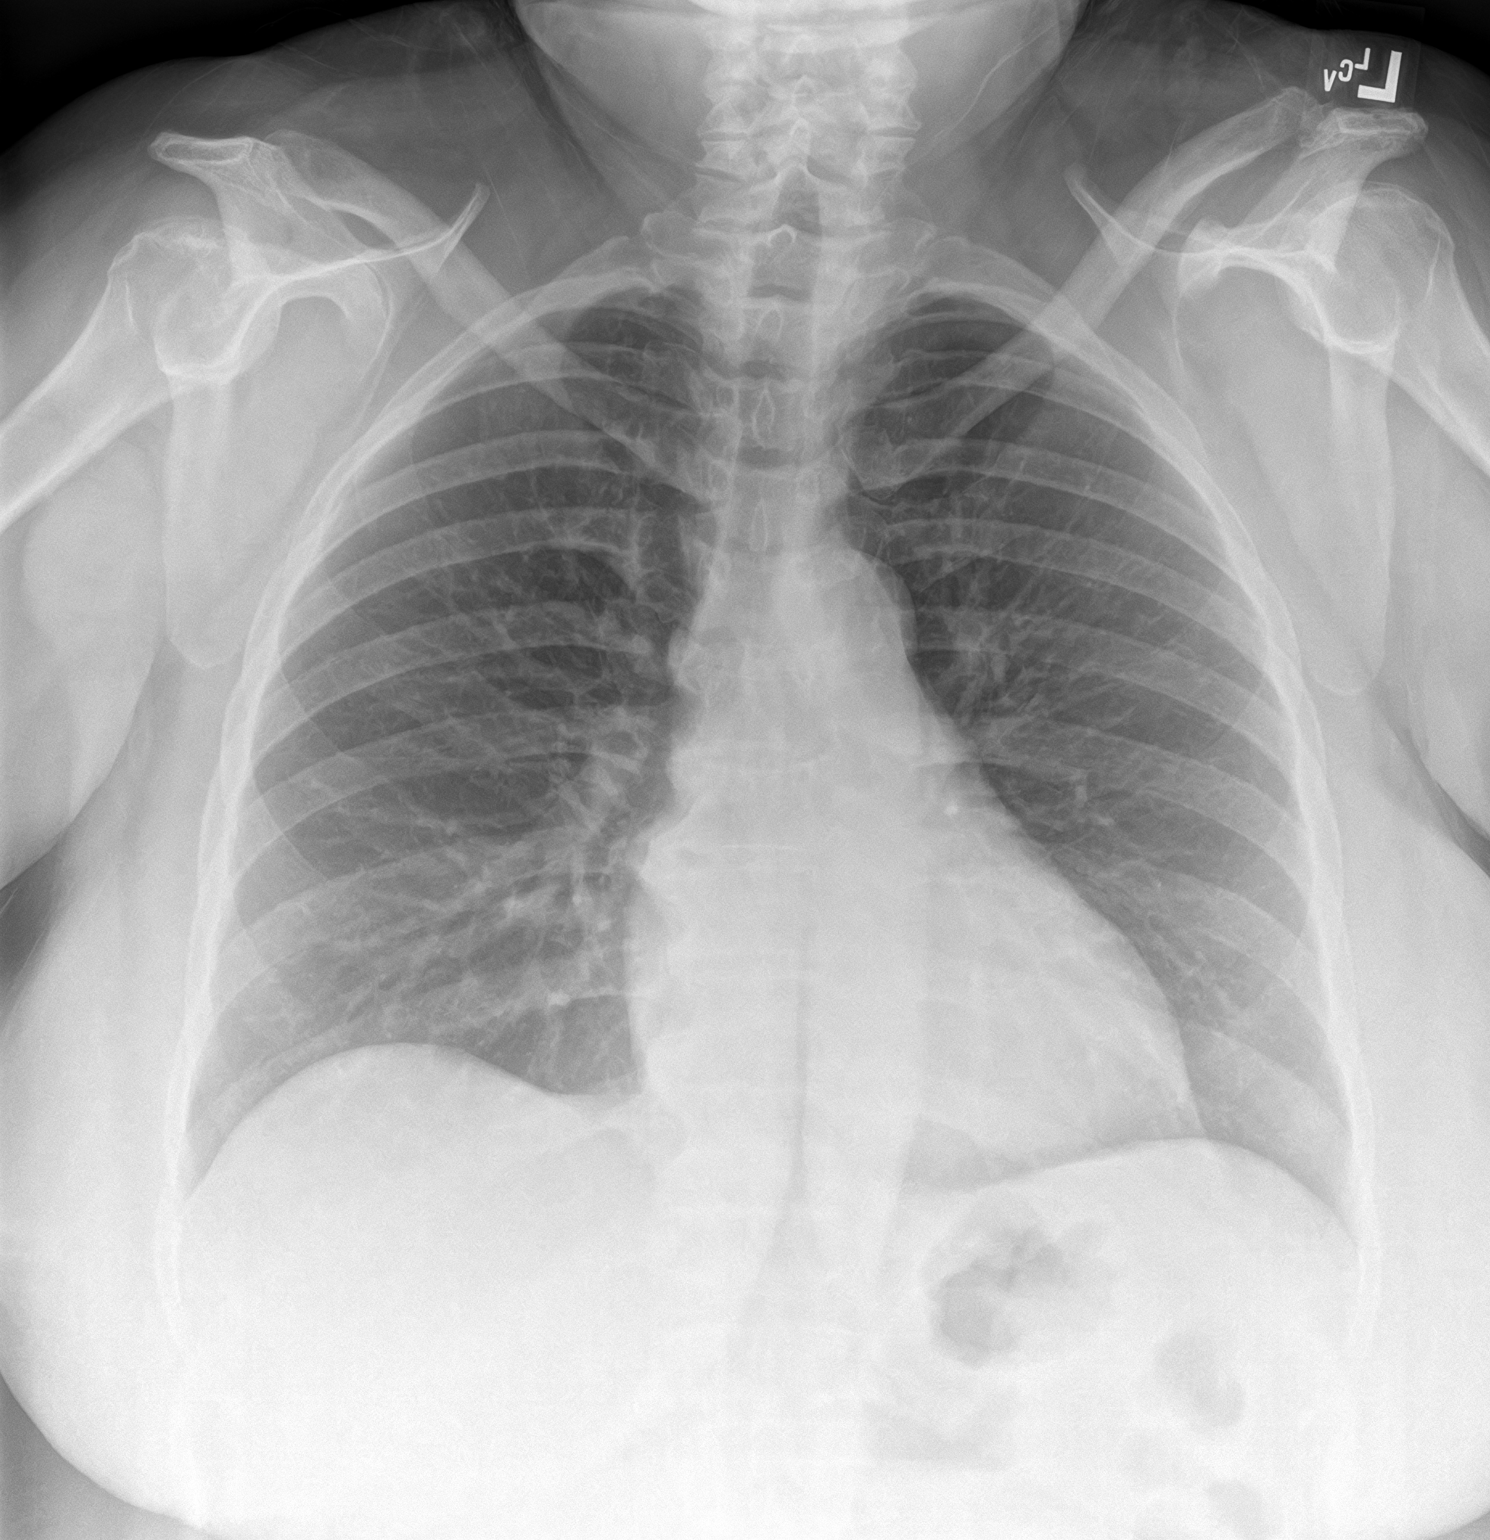
[im 2/2]
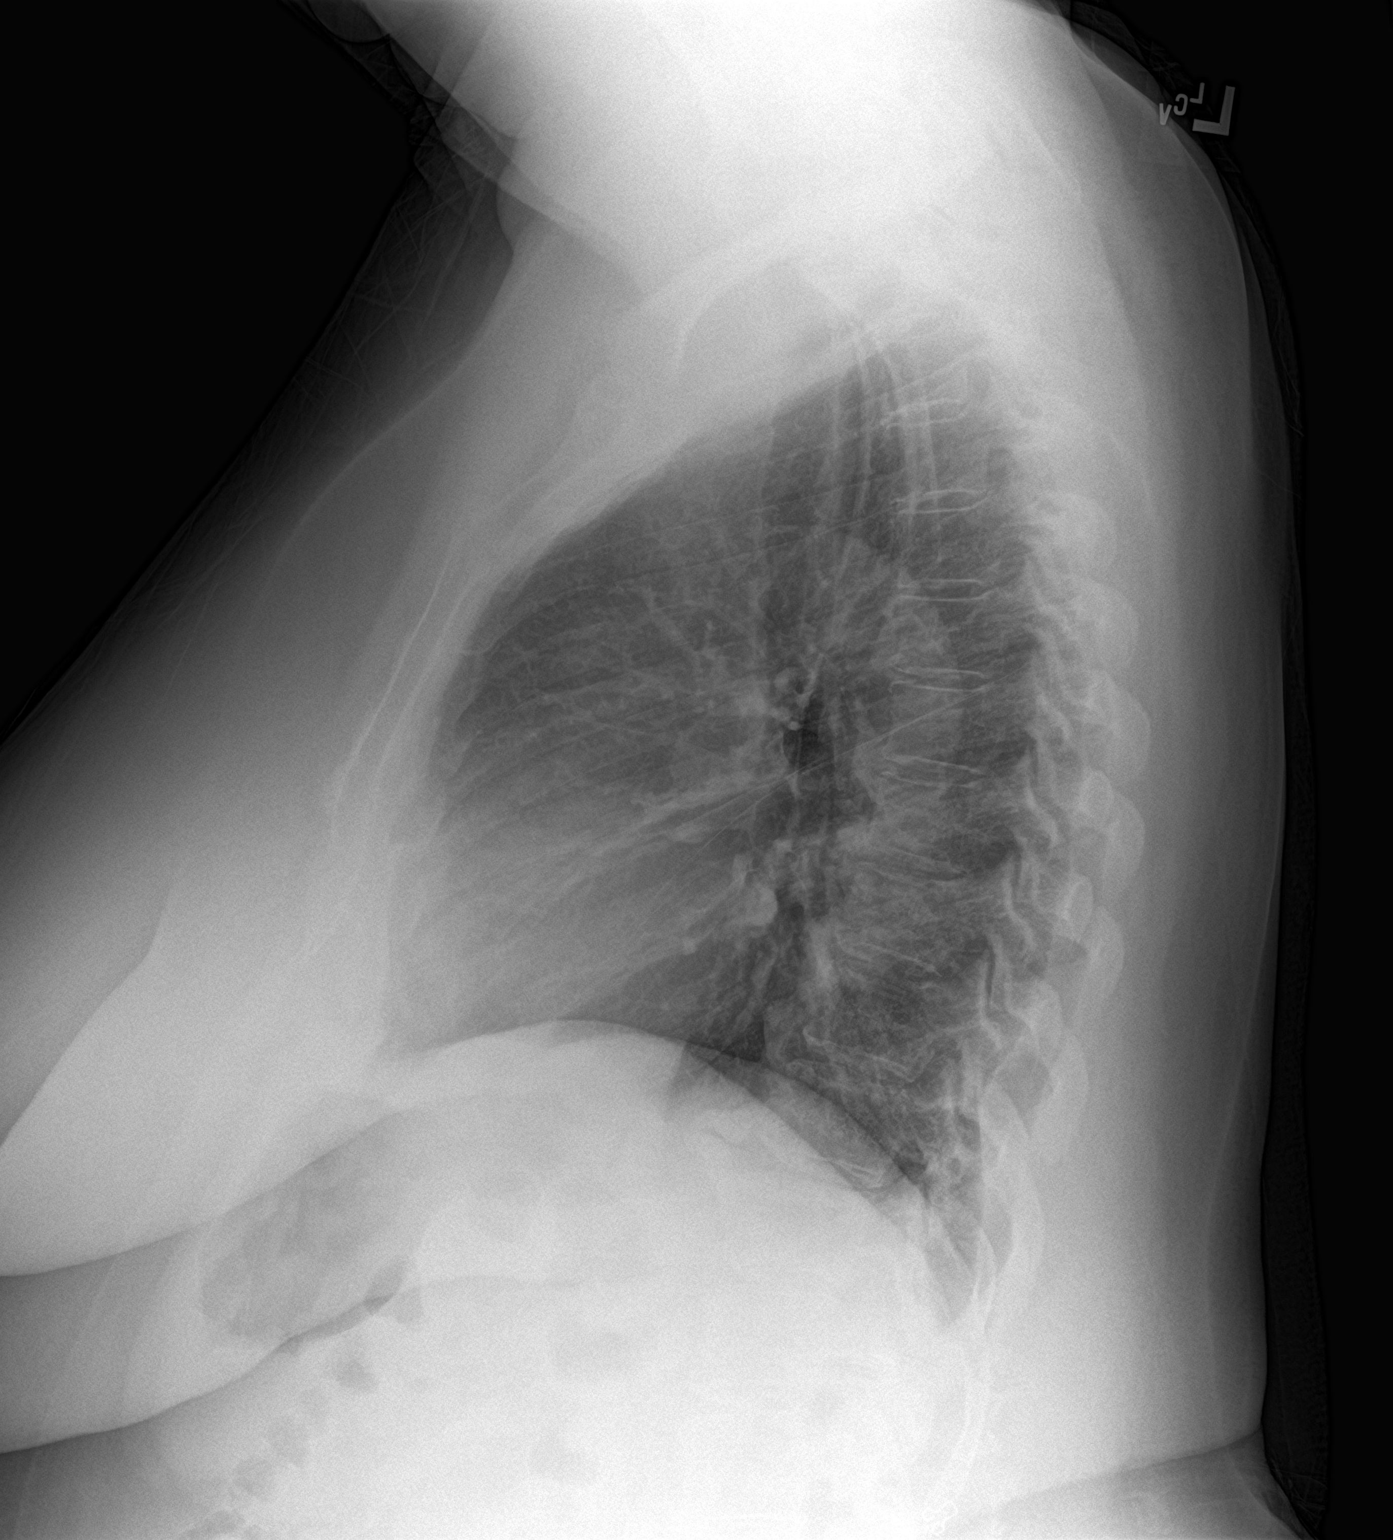

[2 of 2 positions shown; findings below may reference images not displayed]

FINDINGS: The cardiomediastinal contours are normal. The lungs are clear.
Pulmonary vasculature is normal. No consolidation, pleural effusion,
or pneumothorax. No acute osseous abnormalities are seen.
IMPRESSION: No acute chest findings.

## 2021-04-27 IMAGING — US US EXTREM LOW VENOUS*L*
1 series · 14 of 24 positions shown · non-contrast
Comparison: None.

CLINICAL DATA: Leg pain.  Recent surgery.

EXAM:
LEFT LOWER EXTREMITY VENOUS DOPPLER ULTRASOUND
TECHNIQUE: Gray-scale sonography with compression, as well as color and duplex
ultrasound, were performed to evaluate the deep venous system(s)
from the level of the common femoral vein through the popliteal and
proximal calf veins.

[Series 1: us venous img lower uni left (dvt) · portal-venous · 14 of 34 slices shown]
[im 1/34]
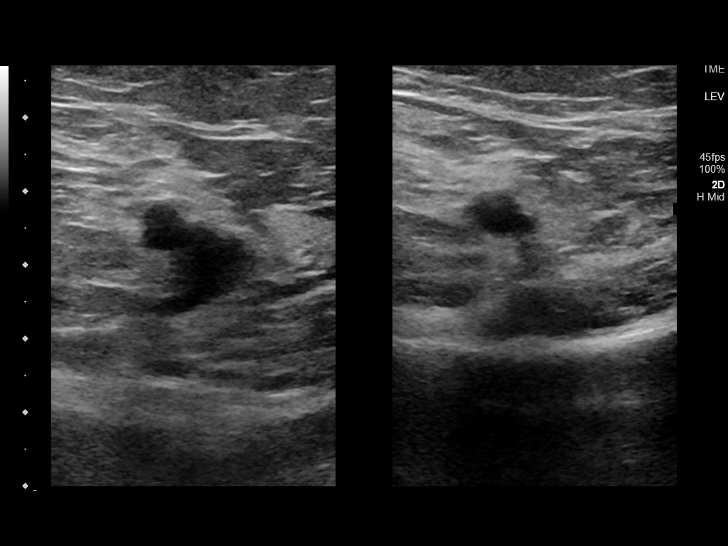
[im 3/34]
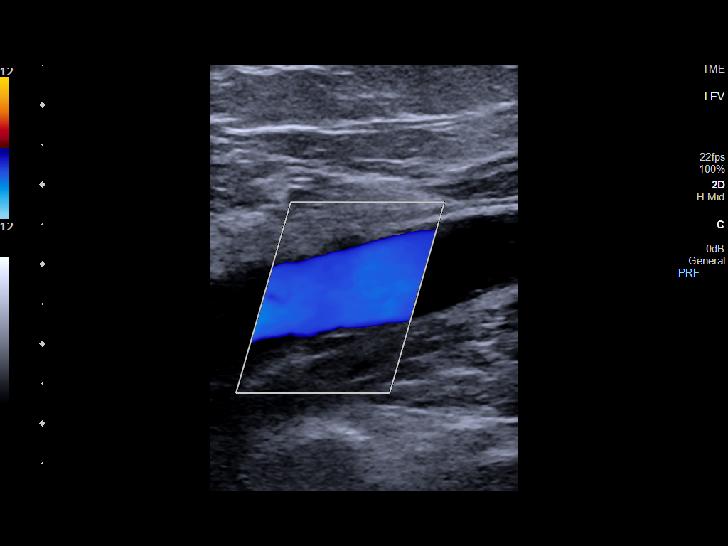
[im 6/34]
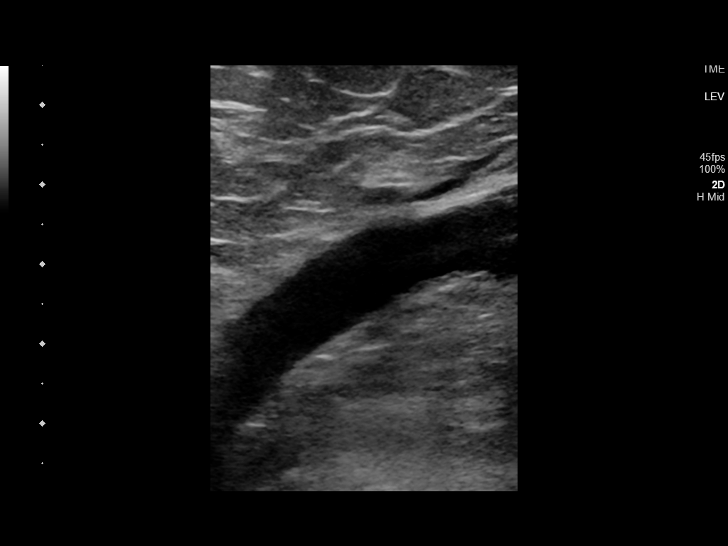
[im 9/34]
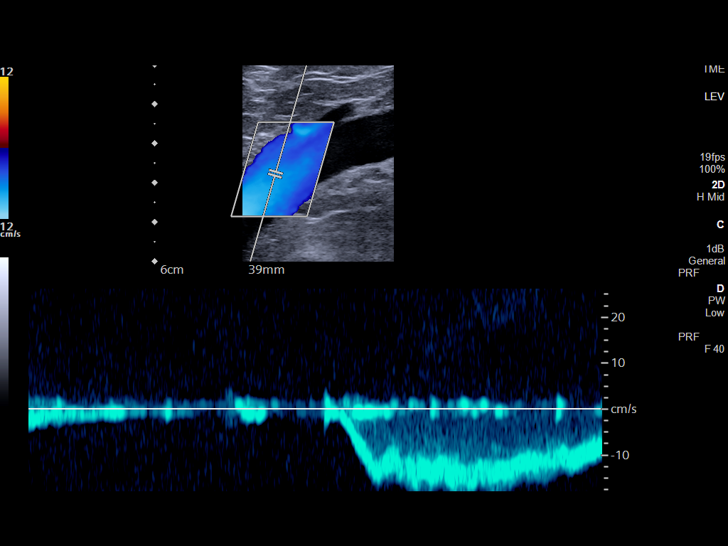
[im 11/34]
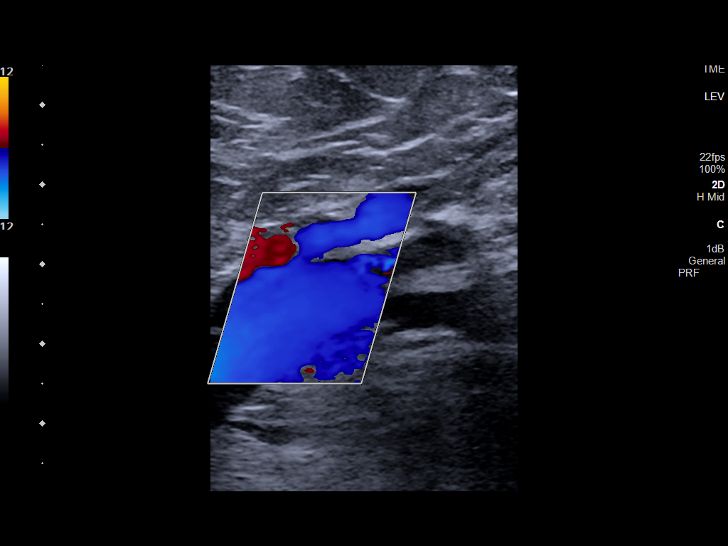
[im 13/34]
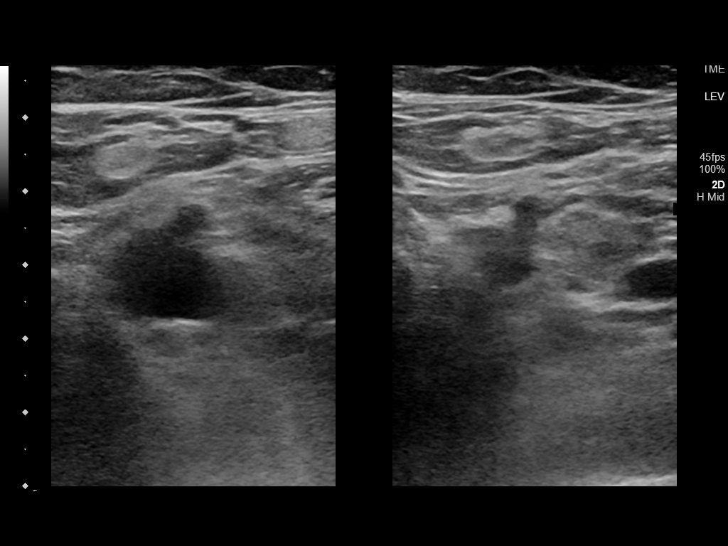
[im 16/34]
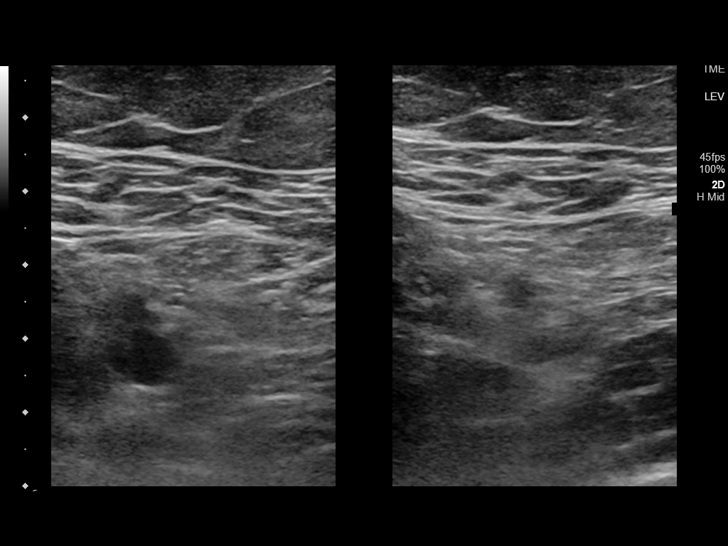
[im 18/34]
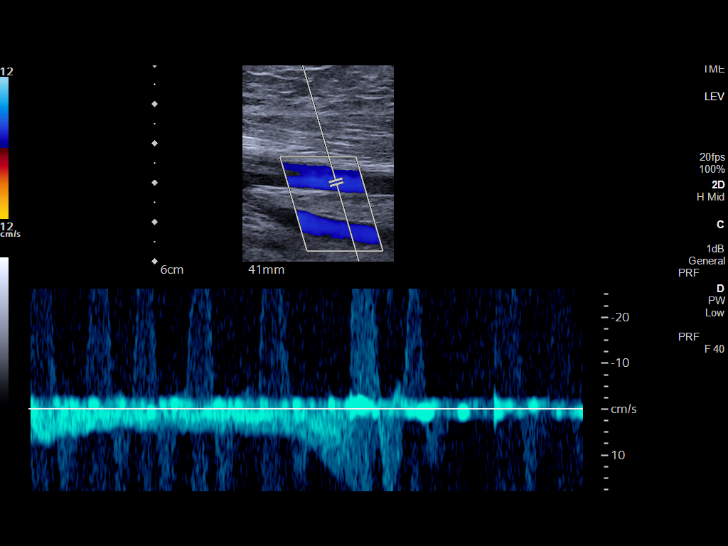
[im 21/34]
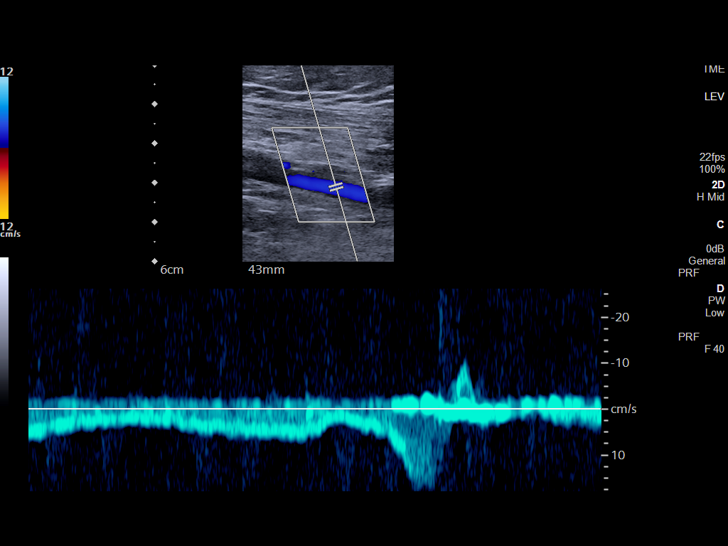
[im 23/34]
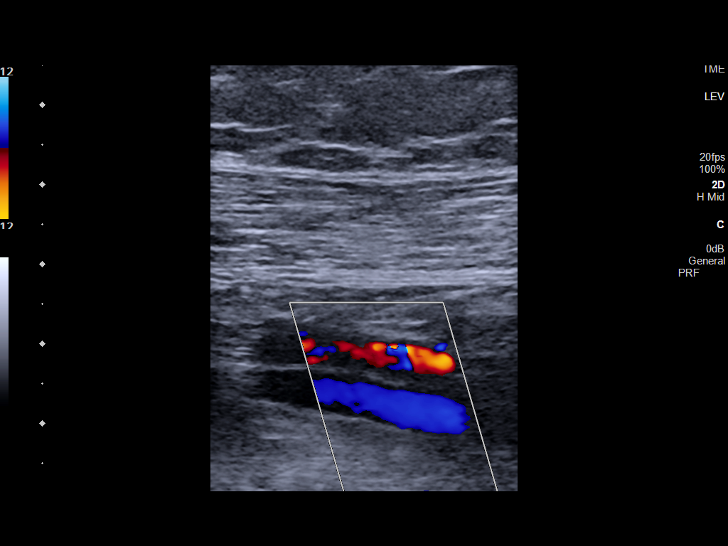
[im 26/34]
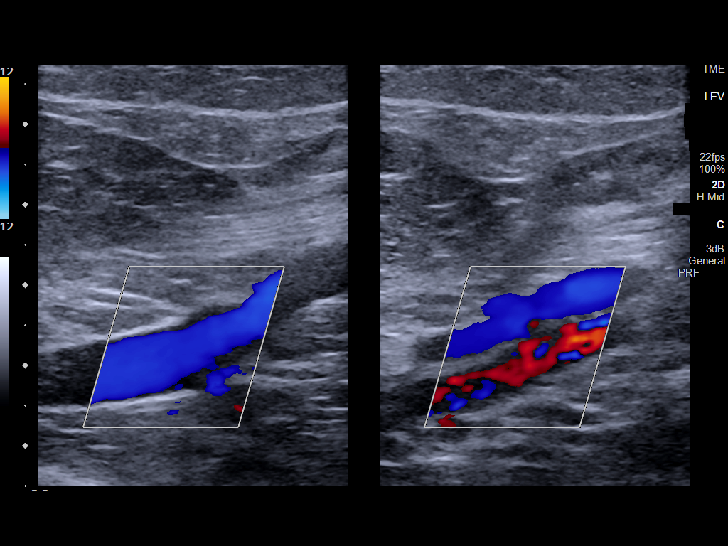
[im 28/34]
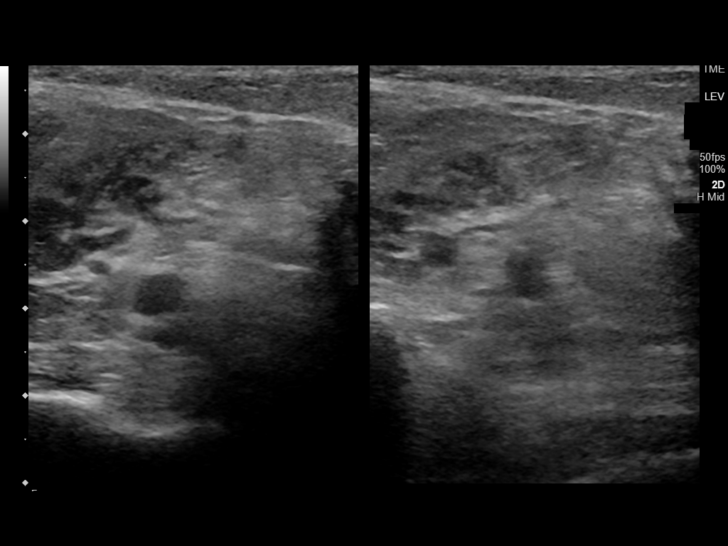
[im 31/34]
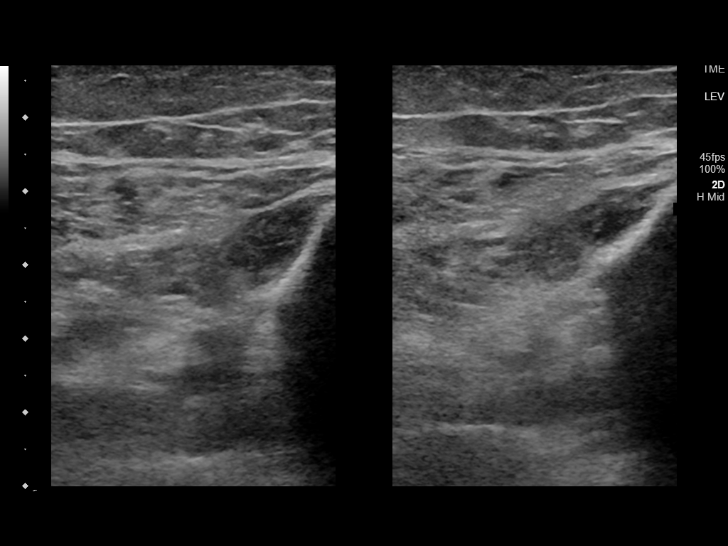
[im 34/34]
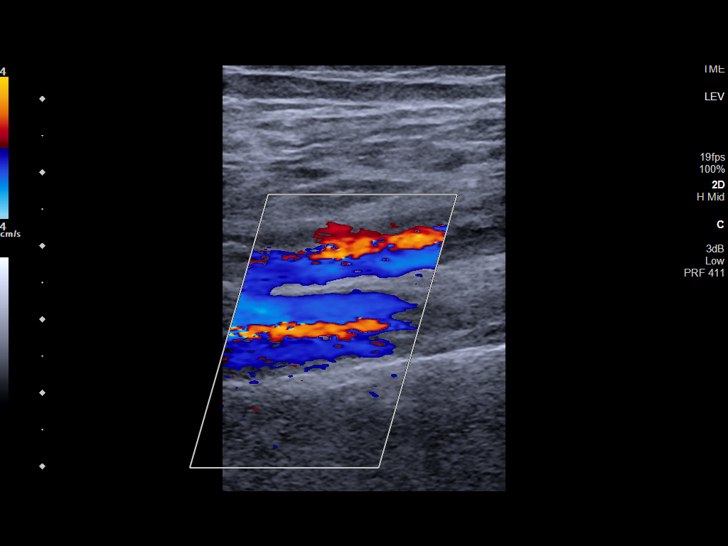

[14 of 24 positions shown; findings below may reference images not displayed]

FINDINGS: VENOUS

Normal compressibility of the common femoral, superficial femoral,
and popliteal veins, as well as the visualized calf veins.
Visualized portions of profunda femoral vein and great saphenous
vein unremarkable. No filling defects to suggest DVT on grayscale or
color Doppler imaging. Doppler waveforms show normal direction of
venous flow, normal respiratory plasticity and response to
augmentation.

Limited views of the contralateral common femoral vein are
unremarkable.

OTHER

None.

Limitations: none
IMPRESSION: Negative.

## 2021-06-13 DIAGNOSIS — M79671 Pain in right foot: Secondary | ICD-10-CM | POA: Diagnosis not present

## 2021-06-13 DIAGNOSIS — S93601A Unspecified sprain of right foot, initial encounter: Secondary | ICD-10-CM | POA: Diagnosis not present

## 2021-06-20 DIAGNOSIS — M79671 Pain in right foot: Secondary | ICD-10-CM | POA: Diagnosis not present

## 2021-06-20 DIAGNOSIS — M79672 Pain in left foot: Secondary | ICD-10-CM | POA: Diagnosis not present

## 2021-07-02 DIAGNOSIS — B3731 Acute candidiasis of vulva and vagina: Secondary | ICD-10-CM | POA: Diagnosis not present

## 2021-07-02 DIAGNOSIS — E210 Primary hyperparathyroidism: Secondary | ICD-10-CM | POA: Diagnosis not present

## 2021-07-02 DIAGNOSIS — E892 Postprocedural hypoparathyroidism: Secondary | ICD-10-CM | POA: Diagnosis not present

## 2021-07-02 DIAGNOSIS — I10 Essential (primary) hypertension: Secondary | ICD-10-CM | POA: Diagnosis not present

## 2021-07-02 DIAGNOSIS — E1165 Type 2 diabetes mellitus with hyperglycemia: Secondary | ICD-10-CM | POA: Diagnosis not present

## 2021-08-15 DIAGNOSIS — I10 Essential (primary) hypertension: Secondary | ICD-10-CM | POA: Diagnosis not present

## 2021-08-15 DIAGNOSIS — M5441 Lumbago with sciatica, right side: Secondary | ICD-10-CM | POA: Diagnosis not present

## 2021-08-15 DIAGNOSIS — E1165 Type 2 diabetes mellitus with hyperglycemia: Secondary | ICD-10-CM | POA: Diagnosis not present

## 2021-08-15 DIAGNOSIS — M5440 Lumbago with sciatica, unspecified side: Secondary | ICD-10-CM | POA: Diagnosis not present

## 2021-09-10 DIAGNOSIS — E892 Postprocedural hypoparathyroidism: Secondary | ICD-10-CM | POA: Diagnosis not present

## 2021-09-10 DIAGNOSIS — Z1231 Encounter for screening mammogram for malignant neoplasm of breast: Secondary | ICD-10-CM | POA: Diagnosis not present

## 2021-09-10 DIAGNOSIS — I10 Essential (primary) hypertension: Secondary | ICD-10-CM | POA: Diagnosis not present

## 2021-09-10 DIAGNOSIS — E1165 Type 2 diabetes mellitus with hyperglycemia: Secondary | ICD-10-CM | POA: Diagnosis not present

## 2021-09-10 DIAGNOSIS — E669 Obesity, unspecified: Secondary | ICD-10-CM | POA: Diagnosis not present

## 2021-09-10 DIAGNOSIS — Z780 Asymptomatic menopausal state: Secondary | ICD-10-CM | POA: Diagnosis not present

## 2021-09-10 DIAGNOSIS — Z1331 Encounter for screening for depression: Secondary | ICD-10-CM | POA: Diagnosis not present

## 2021-09-10 DIAGNOSIS — M5416 Radiculopathy, lumbar region: Secondary | ICD-10-CM | POA: Diagnosis not present

## 2021-09-10 DIAGNOSIS — Z0000 Encounter for general adult medical examination without abnormal findings: Secondary | ICD-10-CM | POA: Diagnosis not present

## 2021-09-13 ENCOUNTER — Other Ambulatory Visit: Payer: Self-pay | Admitting: Internal Medicine

## 2021-09-13 DIAGNOSIS — Z1231 Encounter for screening mammogram for malignant neoplasm of breast: Secondary | ICD-10-CM

## 2021-09-24 DIAGNOSIS — M5442 Lumbago with sciatica, left side: Secondary | ICD-10-CM | POA: Diagnosis not present

## 2021-09-24 DIAGNOSIS — M5441 Lumbago with sciatica, right side: Secondary | ICD-10-CM | POA: Diagnosis not present

## 2021-09-24 DIAGNOSIS — G8929 Other chronic pain: Secondary | ICD-10-CM | POA: Diagnosis not present

## 2021-10-03 DIAGNOSIS — E1169 Type 2 diabetes mellitus with other specified complication: Secondary | ICD-10-CM | POA: Diagnosis not present

## 2021-10-03 DIAGNOSIS — E1159 Type 2 diabetes mellitus with other circulatory complications: Secondary | ICD-10-CM | POA: Diagnosis not present

## 2021-10-03 DIAGNOSIS — E119 Type 2 diabetes mellitus without complications: Secondary | ICD-10-CM | POA: Diagnosis not present

## 2021-10-03 DIAGNOSIS — I152 Hypertension secondary to endocrine disorders: Secondary | ICD-10-CM | POA: Diagnosis not present

## 2021-10-03 DIAGNOSIS — E1165 Type 2 diabetes mellitus with hyperglycemia: Secondary | ICD-10-CM | POA: Diagnosis not present

## 2021-10-03 DIAGNOSIS — E785 Hyperlipidemia, unspecified: Secondary | ICD-10-CM | POA: Diagnosis not present

## 2021-11-08 ENCOUNTER — Telehealth: Admit: 2021-11-08 | Payer: PRIVATE HEALTH INSURANCE | Attending: Internal Medicine | Primary: Internal Medicine

## 2021-12-03 ENCOUNTER — Encounter: Admit: 2021-12-03 | Payer: PRIVATE HEALTH INSURANCE | Attending: Internal Medicine | Primary: Internal Medicine

## 2021-12-04 ENCOUNTER — Encounter: Admit: 2021-12-04 | Payer: PRIVATE HEALTH INSURANCE | Attending: Internal Medicine | Primary: Internal Medicine

## 2021-12-04 ENCOUNTER — Ambulatory Visit: Admit: 2021-12-04 | Payer: Medicare (Managed Care) | Attending: Internal Medicine | Primary: Internal Medicine

## 2021-12-04 DIAGNOSIS — E119 Type 2 diabetes mellitus without complications: Secondary | ICD-10-CM | POA: Diagnosis not present

## 2021-12-04 DIAGNOSIS — E213 Hyperparathyroidism, unspecified: Secondary | ICD-10-CM | POA: Diagnosis not present

## 2021-12-04 DIAGNOSIS — I10 Essential (primary) hypertension: Secondary | ICD-10-CM | POA: Diagnosis not present

## 2021-12-04 DIAGNOSIS — Z0000 Encounter for general adult medical examination without abnormal findings: Secondary | ICD-10-CM | POA: Diagnosis not present

## 2021-12-04 DIAGNOSIS — Z1331 Encounter for screening for depression: Secondary | ICD-10-CM | POA: Diagnosis not present

## 2021-12-04 DIAGNOSIS — Z1231 Encounter for screening mammogram for malignant neoplasm of breast: Secondary | ICD-10-CM | POA: Diagnosis not present

## 2021-12-04 DIAGNOSIS — Z23 Encounter for immunization: Secondary | ICD-10-CM | POA: Diagnosis not present

## 2021-12-04 DIAGNOSIS — E892 Postprocedural hypoparathyroidism: Secondary | ICD-10-CM | POA: Diagnosis not present

## 2021-12-04 MED ORDER — TRAMADOL 50 MG TABLET
50 | Freq: Four times a day (QID) | ORAL | 1.00 refills | 10.00000 days | Status: AC | PRN
Start: 2021-12-04 — End: ?

## 2021-12-04 MED ORDER — TELMISARTAN 80 MG TABLET
80 | ORAL | 3.00 refills | 90.00000 days | Status: AC
Start: 2021-12-04 — End: 2022-03-07

## 2021-12-04 MED ORDER — AMLODIPINE 10 MG TABLET
10 | ORAL | 3.00 refills | 90.00000 days | Status: AC
Start: 2021-12-04 — End: 2022-01-09

## 2021-12-04 MED ORDER — CHOLECALCIFEROL (VITAMIN D3) 25 MCG (1,000 UNIT) TABLET
25 | Freq: Every day | ORAL | 0.00 refills | 84.00000 days | Status: AC
Start: 2021-12-04 — End: 2022-03-07

## 2021-12-04 MED ORDER — HYDROCHLOROTHIAZIDE 25 MG TABLET
25 | ORAL | 3.00 refills | 90.00000 days | Status: AC
Start: 2021-12-04 — End: 2022-03-07

## 2021-12-06 ENCOUNTER — Encounter: Admit: 2021-12-06 | Payer: PRIVATE HEALTH INSURANCE | Attending: Internal Medicine | Primary: Internal Medicine

## 2021-12-10 DIAGNOSIS — M5442 Lumbago with sciatica, left side: Secondary | ICD-10-CM | POA: Diagnosis not present

## 2021-12-20 ENCOUNTER — Ambulatory Visit: Admit: 2021-12-20 | Payer: Medicare (Managed Care) | Attending: Internal Medicine | Primary: Internal Medicine

## 2021-12-20 ENCOUNTER — Ambulatory Visit: Admit: 2021-12-20 | Payer: PRIVATE HEALTH INSURANCE | Attending: Internal Medicine | Primary: Internal Medicine

## 2021-12-20 DIAGNOSIS — Z0000 Encounter for general adult medical examination without abnormal findings: Secondary | ICD-10-CM

## 2021-12-20 DIAGNOSIS — E213 Hyperparathyroidism, unspecified: Secondary | ICD-10-CM

## 2021-12-20 DIAGNOSIS — E119 Type 2 diabetes mellitus without complications: Secondary | ICD-10-CM

## 2021-12-23 ENCOUNTER — Encounter: Admit: 2021-12-23 | Payer: PRIVATE HEALTH INSURANCE | Attending: Internal Medicine | Primary: Internal Medicine

## 2021-12-23 DIAGNOSIS — M4316 Spondylolisthesis, lumbar region: Secondary | ICD-10-CM | POA: Diagnosis not present

## 2021-12-23 DIAGNOSIS — G5622 Lesion of ulnar nerve, left upper limb: Secondary | ICD-10-CM | POA: Diagnosis not present

## 2021-12-23 DIAGNOSIS — M5432 Sciatica, left side: Secondary | ICD-10-CM | POA: Diagnosis not present

## 2021-12-31 ENCOUNTER — Encounter: Admit: 2021-12-31 | Payer: PRIVATE HEALTH INSURANCE | Attending: Internal Medicine | Primary: Internal Medicine

## 2021-12-31 MED ORDER — METFORMIN ER 750 MG TABLET,EXTENDED RELEASE 24 HR
750 | ORAL_TABLET | ORAL | 4 refills | 90.00000 days | Status: AC
Start: 2021-12-31 — End: 2022-02-10

## 2022-01-01 ENCOUNTER — Encounter: Admit: 2022-01-01 | Payer: PRIVATE HEALTH INSURANCE | Attending: Internal Medicine | Primary: Internal Medicine

## 2022-01-09 ENCOUNTER — Encounter: Admit: 2022-01-09 | Payer: PRIVATE HEALTH INSURANCE | Attending: Internal Medicine | Primary: Internal Medicine

## 2022-01-09 MED ORDER — PRAVASTATIN 40 MG TABLET
40 | ORAL_TABLET | Freq: Every day | ORAL | 4 refills | 90.00000 days | Status: AC
Start: 2022-01-09 — End: 2022-03-07

## 2022-01-09 MED ORDER — AMLODIPINE 10 MG TABLET
10 | ORAL_TABLET | Freq: Every day | ORAL | 5 refills | 90.00000 days | Status: AC
Start: 2022-01-09 — End: 2022-03-07

## 2022-01-29 DIAGNOSIS — M25471 Effusion, right ankle: Secondary | ICD-10-CM | POA: Diagnosis not present

## 2022-01-29 DIAGNOSIS — S92531A Displaced fracture of distal phalanx of right lesser toe(s), initial encounter for closed fracture: Secondary | ICD-10-CM | POA: Diagnosis not present

## 2022-01-29 DIAGNOSIS — S92534A Nondisplaced fracture of distal phalanx of right lesser toe(s), initial encounter for closed fracture: Secondary | ICD-10-CM | POA: Diagnosis not present

## 2022-01-31 DIAGNOSIS — S92511A Displaced fracture of proximal phalanx of right lesser toe(s), initial encounter for closed fracture: Secondary | ICD-10-CM | POA: Diagnosis not present

## 2022-01-31 DIAGNOSIS — S92331A Displaced fracture of third metatarsal bone, right foot, initial encounter for closed fracture: Secondary | ICD-10-CM | POA: Diagnosis not present

## 2022-01-31 DIAGNOSIS — S92321A Displaced fracture of second metatarsal bone, right foot, initial encounter for closed fracture: Secondary | ICD-10-CM | POA: Diagnosis not present

## 2022-02-10 ENCOUNTER — Encounter: Admit: 2022-02-10 | Payer: PRIVATE HEALTH INSURANCE | Attending: Internal Medicine | Primary: Internal Medicine

## 2022-02-10 MED ORDER — METFORMIN ER 750 MG TABLET,EXTENDED RELEASE 24 HR
750 | ORAL_TABLET | ORAL | 4 refills | 90.00000 days | Status: AC
Start: 2022-02-10 — End: 2022-03-07

## 2022-02-17 IMAGING — CT CT ABD-PELV W/ CM
2 of 5 series · 15 of 46 positions shown, 17 images · IV contrast (omnipaque)
Comparison: None.

CLINICAL DATA: worsening abdominal discomfort with severe pain
after eating x 1 month. She also c/o worsening gaseous symptoms
without releif from gas-x and tums.

EXAM:
CT ABDOMEN AND PELVIS WITH CONTRAST
TECHNIQUE: Multidetector CT imaging of the abdomen and pelvis was performed
using the standard protocol following bolus administration of
intravenous contrast.
CONTRAST:  100mL OMNIPAQUE IOHEXOL 300 MG/ML  SOLN

[Series 2: abd pelvis 5.00 · axial · 0.70mm/px · z∈[-1625,-1195]mm · 12 of 98 slices shown, 14 images]
[im 6/98  soft-tissue]
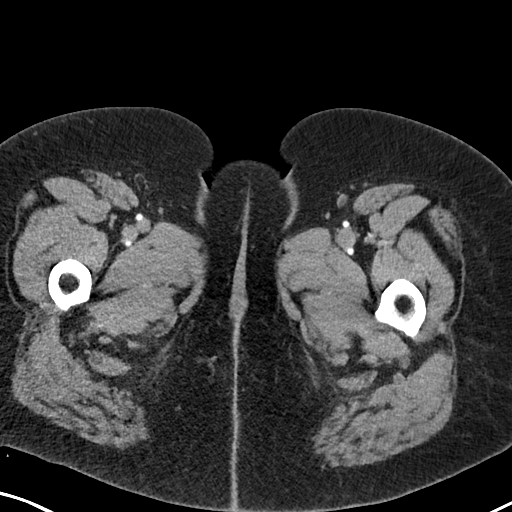
[im 6/98  bone]
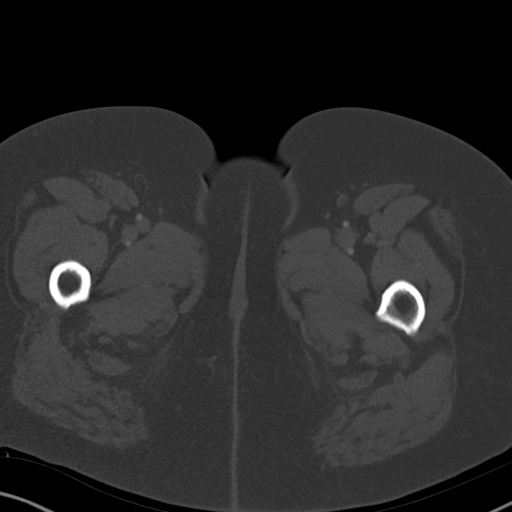
[im 17/98  soft-tissue]
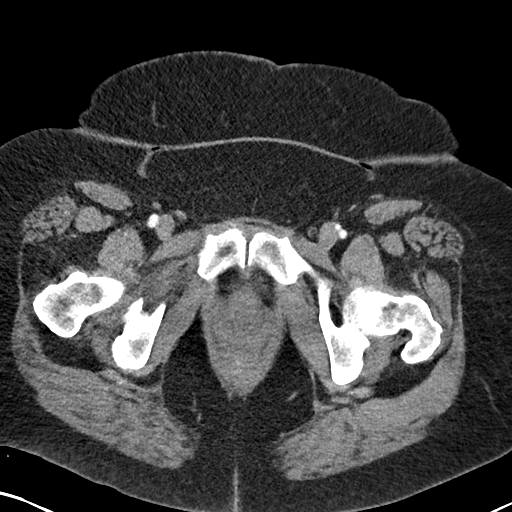
[im 22/98  soft-tissue]
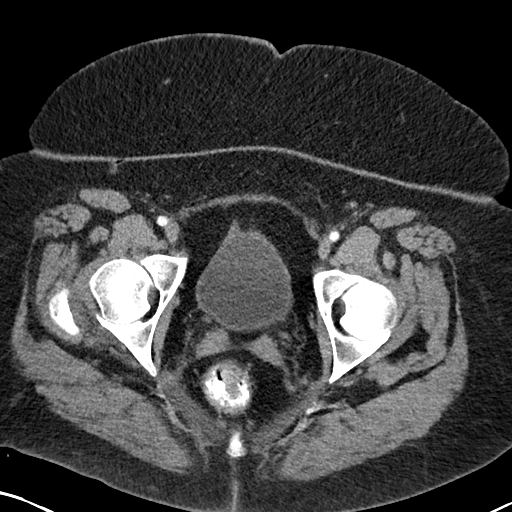
[im 27/98  soft-tissue]
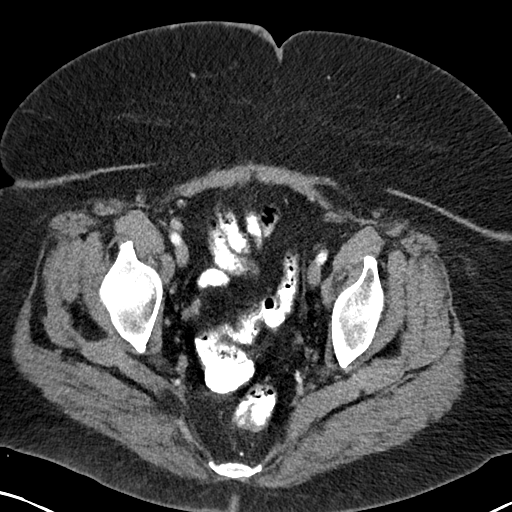
[im 38/98  soft-tissue]
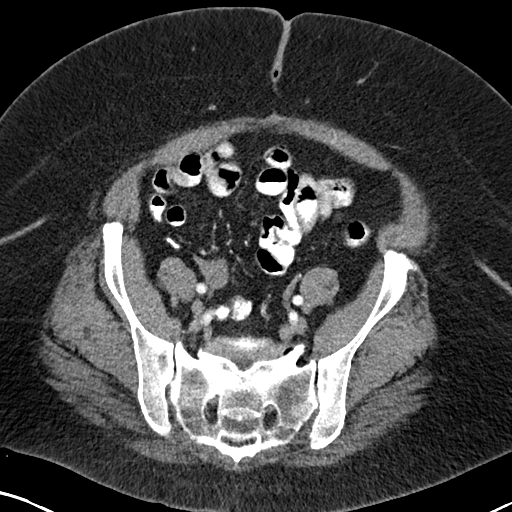
[im 44/98  soft-tissue]
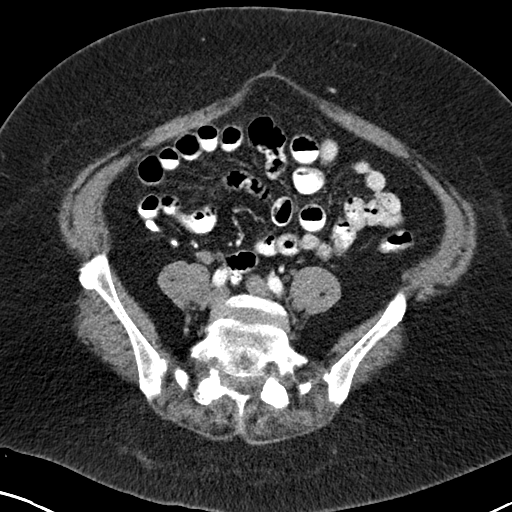
[im 54/98  soft-tissue]
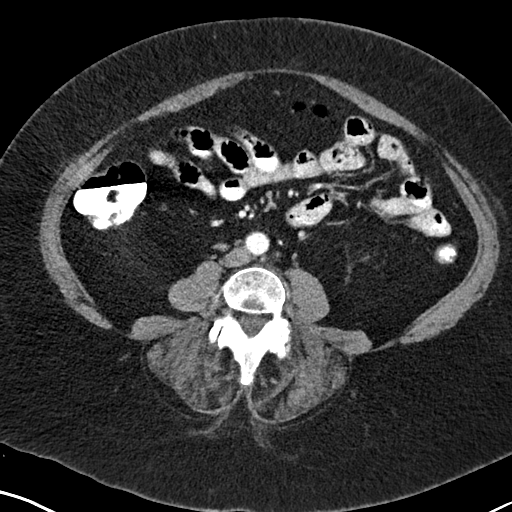
[im 60/98  soft-tissue]
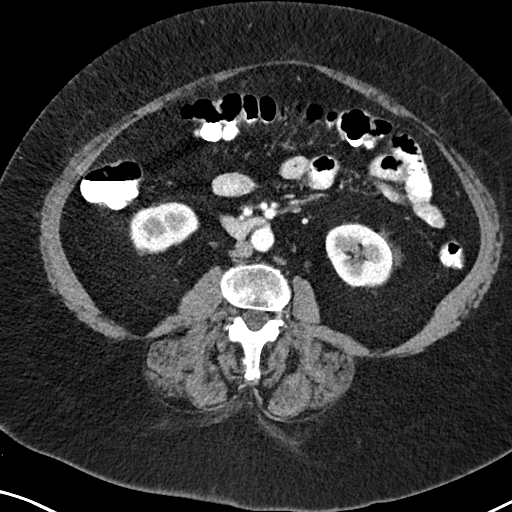
[im 71/98  soft-tissue]
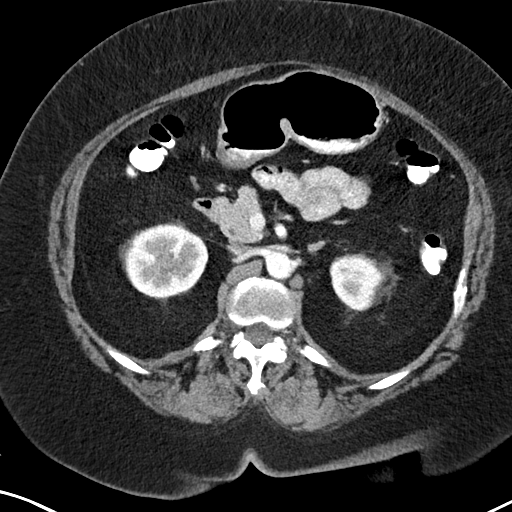
[im 71/98  bone]
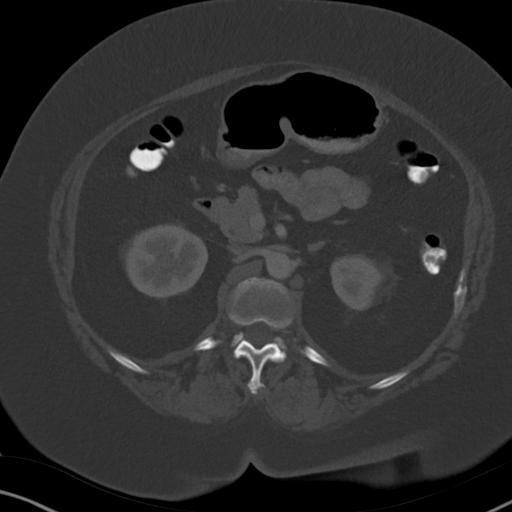
[im 76/98  soft-tissue]
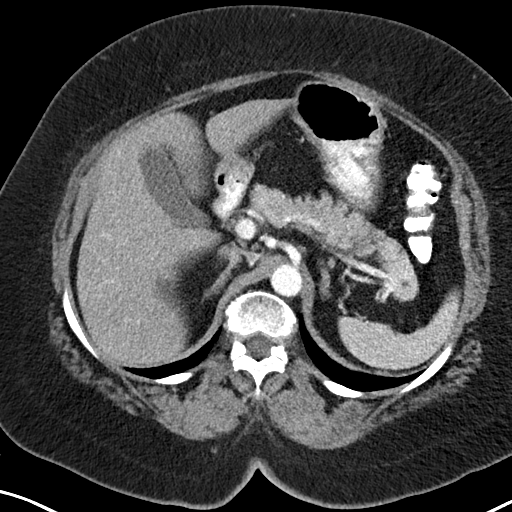
[im 81/98  soft-tissue]
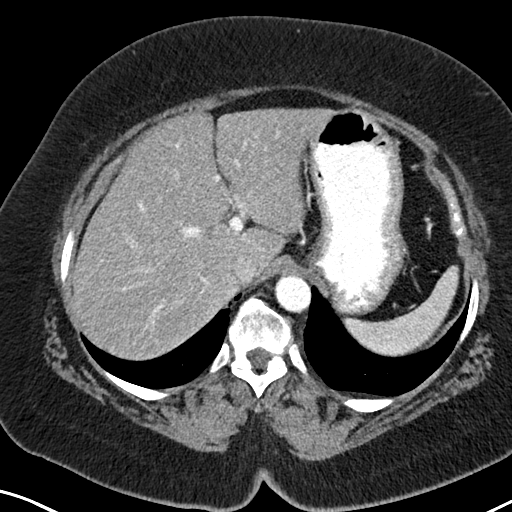
[im 92/98  soft-tissue]
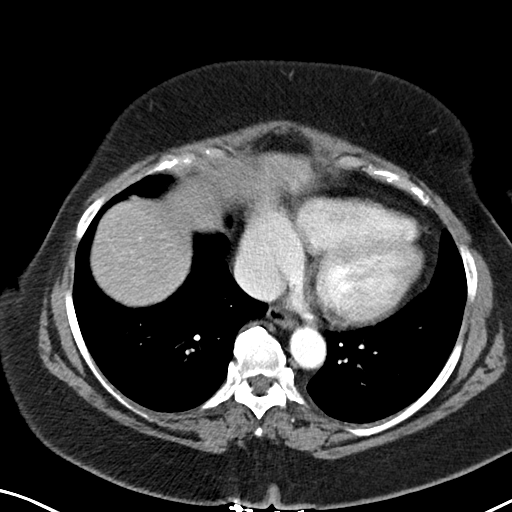

[Series 4: coronals abd pelvis 2.00 cor · coronal · 0.70mm/px · 3 of 178 slices shown]
[im 60/178  soft-tissue]
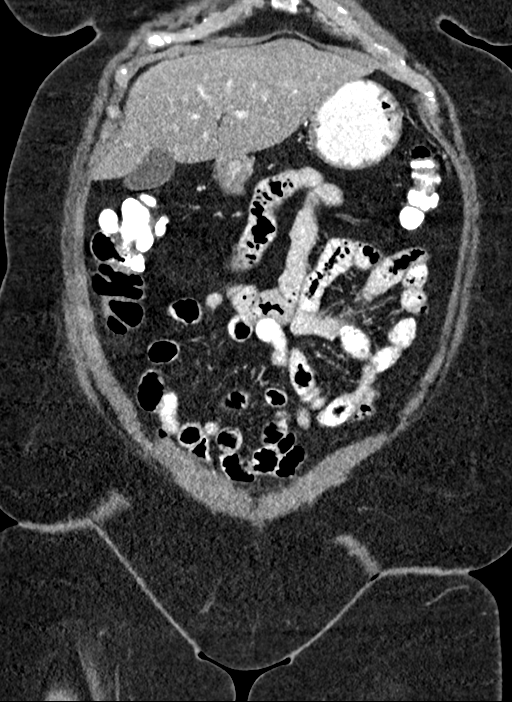
[im 79/178  soft-tissue]
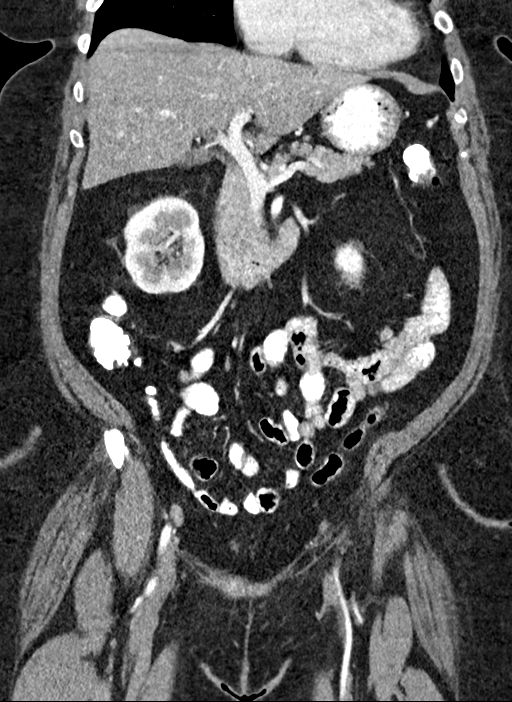
[im 99/178  soft-tissue]
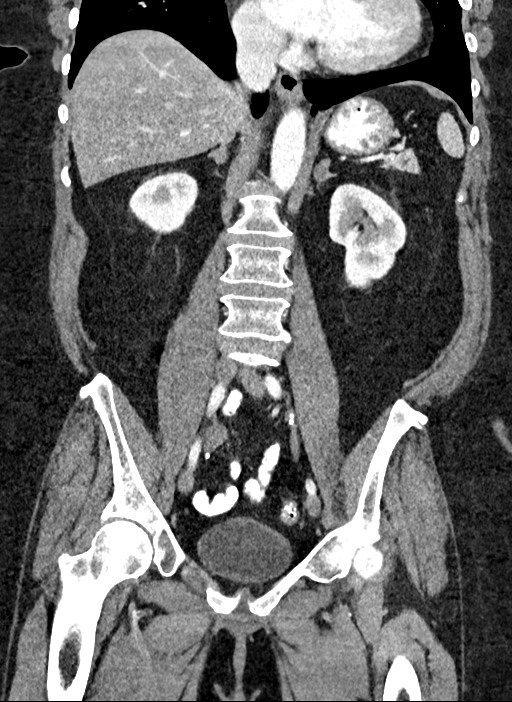

[15 of 46 positions shown; findings below may reference images not displayed]

FINDINGS: Lower chest: No acute abnormality.

Hepatobiliary: Mild diffuse hepatic steatosis. No suspicious hepatic
lesion. Gallbladder is unremarkable. No biliary ductal dilatation.

Pancreas: Tiny 5 mm cystic lesion in the tail the pancreas. No
pancreatic inflammation or ductal dilation.

Spleen: Unremarkable

Adrenals/Urinary Tract: 1.2 cm left adrenal nodule. Right adrenal is
unremarkable. No hydronephrosis. No suspicious renal masses. No
filling defect in the opacified portions of the collecting system
and proximal ureter on delayed imaging. Urinary bladder is
unremarkable.

Stomach/Bowel: Small hiatal hernia, stomach is otherwise
unremarkable. No suspicious small bowel wall thickening or dilation.
Normal appendix and terminal ileum. No suspicious colonic wall
thickening or mass like lesions.

Vascular/Lymphatic: Aortic atherosclerosis. No enlarged abdominal or
pelvic lymph nodes.

Reproductive: Status post hysterectomy. No adnexal masses.

Other: No abdominopelvic ascites.

Musculoskeletal: Multilevel degenerative changes spine. Thoracic
diffuse idiopathic skeletal hyperostosis. Lower lumbar posterior
spinal fusion hardware. No acute osseous abnormality.
IMPRESSION: 1. No acute abnormality in the abdomen or pelvis.
2. Mild diffuse hepatic steatosis.
3. Tiny 5 mm cystic lesion in the tail of the pancreas. Recommend
follow up pre and post contrast MRI/MRCP or pancreatic protocol CT
in 2 years. This recommendation follows ACR consensus guidelines:
Management of Incidental Pancreatic Cysts: A White Paper of the ACR
Incidental Findings Committee. [HOSPITAL] 4898;[DATE].
4. 1.2 cm left adrenal nodule, statistically likely an adenoma.
5. Aortic atherosclerosis.

Aortic Atherosclerosis ([id number]).

## 2022-03-07 ENCOUNTER — Encounter: Admit: 2022-03-07 | Payer: PRIVATE HEALTH INSURANCE | Attending: Internal Medicine | Primary: Internal Medicine

## 2022-03-07 ENCOUNTER — Encounter: Admit: 2022-03-07 | Payer: Medicare (Managed Care) | Attending: Internal Medicine | Primary: Internal Medicine

## 2022-03-07 MED ORDER — METFORMIN ER 750 MG TABLET,EXTENDED RELEASE 24 HR
750 | ORAL_TABLET | ORAL | 4 refills | 90.00000 days | Status: AC
Start: 2022-03-07 — End: 2023-03-05

## 2022-03-07 MED ORDER — FLUTICASONE PROPIONATE 50 MCG/ACTUATION NASAL SPRAY,SUSPENSION
50 | Freq: Every day | NASAL | 12 refills | 30.00000 days | Status: AC
Start: 2022-03-07 — End: 2022-09-10

## 2022-03-07 MED ORDER — ASPIRIN 81 MG TABLET,DELAYED RELEASE
81 | ORAL_TABLET | Freq: Every day | ORAL | 1 refills | 90.00000 days | Status: AC
Start: 2022-03-07 — End: ?

## 2022-03-07 MED ORDER — AMLODIPINE 10 MG TABLET
10 | ORAL_TABLET | Freq: Every day | ORAL | 5 refills | 90.00000 days | Status: AC
Start: 2022-03-07 — End: 2022-03-26

## 2022-03-07 MED ORDER — TRUE METRIX LEVEL 2 SOLUTION
4.00 refills | 75.00000 days | Status: AC
Start: 2022-03-07 — End: 2022-07-08

## 2022-03-07 MED ORDER — OZEMPIC 0.25 MG OR 0.5 MG (2 MG/3 ML) SUBCUTANEOUS PEN INJECTOR
0.25 | SUBCUTANEOUS | 1 refills | 28.00000 days | Status: AC
Start: 2022-03-07 — End: ?

## 2022-03-07 MED ORDER — ALBUTEROL SULFATE HFA 90 MCG/ACTUATION AEROSOL INHALER
90 | Freq: Four times a day (QID) | RESPIRATORY_TRACT | 4 refills | 25.00000 days | Status: AC | PRN
Start: 2022-03-07 — End: ?

## 2022-03-07 MED ORDER — CHOLECALCIFEROL (VITAMIN D3) 25 MCG (1,000 UNIT) TABLET
25 | ORAL_TABLET | Freq: Every day | ORAL | 1 refills | 84.00000 days | Status: AC
Start: 2022-03-07 — End: ?

## 2022-03-07 MED ORDER — PRAVASTATIN 40 MG TABLET
40 | ORAL_TABLET | Freq: Every day | ORAL | 4 refills | 90.00000 days | Status: AC
Start: 2022-03-07 — End: 2022-03-26

## 2022-03-07 MED ORDER — TELMISARTAN 80 MG-HYDROCHLOROTHIAZIDE 25 MG TABLET
80-25 | ORAL_TABLET | Freq: Every day | ORAL | 4 refills | 90.00000 days | Status: AC
Start: 2022-03-07 — End: 2022-09-10

## 2022-03-10 ENCOUNTER — Encounter: Admit: 2022-03-10 | Payer: PRIVATE HEALTH INSURANCE | Attending: Ophthalmology | Primary: Internal Medicine

## 2022-03-10 ENCOUNTER — Ambulatory Visit: Admit: 2022-03-10 | Payer: PRIVATE HEALTH INSURANCE | Primary: Internal Medicine

## 2022-03-10 ENCOUNTER — Telehealth: Admit: 2022-03-10 | Payer: PRIVATE HEALTH INSURANCE | Primary: Internal Medicine

## 2022-03-10 ENCOUNTER — Ambulatory Visit: Admit: 2022-03-10 | Payer: PRIVATE HEALTH INSURANCE | Attending: Ophthalmology | Primary: Internal Medicine

## 2022-03-10 DIAGNOSIS — I5189 Other ill-defined heart diseases: Secondary | ICD-10-CM

## 2022-03-10 DIAGNOSIS — Z87891 Personal history of nicotine dependence: Secondary | ICD-10-CM

## 2022-03-10 DIAGNOSIS — H269 Unspecified cataract: Secondary | ICD-10-CM

## 2022-03-10 DIAGNOSIS — I272 Pulmonary hypertension, unspecified: Secondary | ICD-10-CM

## 2022-03-10 DIAGNOSIS — E785 Hyperlipidemia, unspecified: Secondary | ICD-10-CM

## 2022-03-10 DIAGNOSIS — E119 Type 2 diabetes mellitus without complications: Secondary | ICD-10-CM

## 2022-03-10 DIAGNOSIS — E559 Vitamin D deficiency, unspecified: Secondary | ICD-10-CM

## 2022-03-10 DIAGNOSIS — K5730 Diverticulosis of large intestine without perforation or abscess without bleeding: Secondary | ICD-10-CM

## 2022-03-10 DIAGNOSIS — R600 Localized edema: Secondary | ICD-10-CM

## 2022-03-10 DIAGNOSIS — H35033 Hypertensive retinopathy, bilateral: Secondary | ICD-10-CM

## 2022-03-10 DIAGNOSIS — R7302 Impaired glucose tolerance (oral): Secondary | ICD-10-CM

## 2022-03-10 DIAGNOSIS — I10 Essential (primary) hypertension: Secondary | ICD-10-CM

## 2022-03-10 DIAGNOSIS — I517 Cardiomegaly: Secondary | ICD-10-CM

## 2022-03-10 DIAGNOSIS — M545 LBP (low back pain): Secondary | ICD-10-CM

## 2022-03-10 DIAGNOSIS — H43812 Vitreous degeneration, left eye: Secondary | ICD-10-CM

## 2022-03-10 DIAGNOSIS — H3509 Other intraretinal microvascular abnormalities: Secondary | ICD-10-CM

## 2022-03-10 DIAGNOSIS — J9801 Acute bronchospasm: Secondary | ICD-10-CM

## 2022-03-10 DIAGNOSIS — Z8659 Personal history of other mental and behavioral disorders: Secondary | ICD-10-CM

## 2022-03-10 NOTE — Telephone Encounter
R/T pt call, pt is refusing Golytely. Wants different prep, is willing to have pre-colon visit for different type of prep. Sending referral to front desk team to schedule.

## 2022-03-10 NOTE — Telephone Encounter
1st Attempt: Left VM to schedule colon

## 2022-03-10 NOTE — Patient Instructions
Retina follow-up exam in 12 months , or sooner if any change in vision or floaters or flashes, or vision distortion.Discussed retinal exam findings with patient, and went over the wide-angle photographs and auto fluorescence both eyes together. [place]

## 2022-03-10 NOTE — Telephone Encounter
Patient returning call to schedule colonoscopy.Best call back # [phone]

## 2022-03-10 NOTE — Progress Notes
IMPRESSION:Patient referred for retinal evaluation consult 03-10-22:  -   per Sherolyn Buba, MD  -  Hx of type 2 DM x 10 years. Last A1C 6.9 2/2024Exam 03/10/2022:1.hypertensive retinopathy Stage 2 Both Eyes   -  Mild attenuation arterioles Both Eyes   -  No vessel occlusions Both Eyes  2. Retinal arteriolar sclerosis both eyesretinal arteriolar sclerosis Both Eyes   -  No emboli Both Eyes   -  No vessel occlusions Both Eyes  3. Type 2 diabetes mellitus without retinopathy both eyes  -  no microaneursyms or DBH or exudate Both Eyes    -  no DME both eyes4. Retinal RPE depigmented spots perifoveal and peripheral both eyes  -  may be a form of early AMD both eyes and some degenerative drusen both eyes   -  may also be related to history of hypertension with some hypertensive retinopathy both eyes5. Vitreous detachment left eye  -  Weiss ring, no vitreous hemorrhage or cell  -  no ERM  -  no retinal holes or tears on 360 degree scleral depression  -  per patient this floater has been old6. Cataract both eyes Plan:Retina follow-up exam in 12 months , or sooner if any change in vision or floaters or flashes, or vision distortion.Discussed retinal exam findings with patient, and went over the wide-angle photographs and auto fluorescence both eyes together. Patient also may use the [facility] in [place], [place] for update on glasses.

## 2022-03-12 ENCOUNTER — Ambulatory Visit: Admit: 2022-03-12 | Payer: PRIVATE HEALTH INSURANCE | Primary: Internal Medicine

## 2022-03-12 ENCOUNTER — Inpatient Hospital Stay: Admit: 2022-03-12 | Discharge: 2022-03-12 | Payer: PRIVATE HEALTH INSURANCE | Primary: Internal Medicine

## 2022-03-12 DIAGNOSIS — Z1231 Encounter for screening mammogram for malignant neoplasm of breast: Secondary | ICD-10-CM

## 2022-03-17 ENCOUNTER — Encounter: Admit: 2022-03-17 | Payer: PRIVATE HEALTH INSURANCE | Primary: Internal Medicine

## 2022-03-20 ENCOUNTER — Encounter: Admit: 2022-03-20 | Payer: PRIVATE HEALTH INSURANCE | Primary: Internal Medicine

## 2022-03-26 ENCOUNTER — Encounter: Admit: 2022-03-26 | Payer: PRIVATE HEALTH INSURANCE | Attending: Internal Medicine | Primary: Internal Medicine

## 2022-03-26 MED ORDER — AMLODIPINE 10 MG TABLET
10 | ORAL_TABLET | Freq: Every day | ORAL | 5 refills | 90.00000 days | Status: AC
Start: 2022-03-26 — End: 2023-03-05

## 2022-03-26 MED ORDER — PRAVASTATIN 40 MG TABLET
40 | ORAL_TABLET | Freq: Every day | ORAL | 4 refills | 90.00000 days | Status: AC
Start: 2022-03-26 — End: 2023-02-24

## 2022-04-17 ENCOUNTER — Encounter: Admit: 2022-04-17 | Payer: PRIVATE HEALTH INSURANCE | Primary: Internal Medicine

## 2022-04-17 DIAGNOSIS — I5189 Other ill-defined heart diseases: Secondary | ICD-10-CM

## 2022-04-17 DIAGNOSIS — M545 LBP (low back pain): Secondary | ICD-10-CM

## 2022-04-17 DIAGNOSIS — R7302 Impaired glucose tolerance (oral): Secondary | ICD-10-CM

## 2022-04-17 DIAGNOSIS — K5730 Diverticulosis of large intestine without perforation or abscess without bleeding: Secondary | ICD-10-CM

## 2022-04-17 DIAGNOSIS — E119 Type 2 diabetes mellitus without complications: Secondary | ICD-10-CM

## 2022-04-17 DIAGNOSIS — Z87891 Personal history of nicotine dependence: Secondary | ICD-10-CM

## 2022-04-17 DIAGNOSIS — R600 Localized edema: Secondary | ICD-10-CM

## 2022-04-17 DIAGNOSIS — J9801 Acute bronchospasm: Secondary | ICD-10-CM

## 2022-04-17 DIAGNOSIS — Z1211 Encounter for screening for malignant neoplasm of colon: Secondary | ICD-10-CM

## 2022-04-17 DIAGNOSIS — I517 Cardiomegaly: Secondary | ICD-10-CM

## 2022-04-17 DIAGNOSIS — E559 Vitamin D deficiency, unspecified: Secondary | ICD-10-CM

## 2022-04-17 DIAGNOSIS — E785 Hyperlipidemia, unspecified: Secondary | ICD-10-CM

## 2022-04-17 DIAGNOSIS — I272 Pulmonary hypertension, unspecified: Secondary | ICD-10-CM

## 2022-04-17 DIAGNOSIS — I10 Essential (primary) hypertension: Secondary | ICD-10-CM

## 2022-04-17 DIAGNOSIS — Z8659 Personal history of other mental and behavioral disorders: Secondary | ICD-10-CM

## 2022-04-17 MED ORDER — SODIUM SUL 1.479 GRAM-POTAS CH 0.188 GRAM-MAGNES SUL 0.225 GRAM TABLET
1.479-0.188-0.225 0.225 gram | ORAL_TABLET | ORAL | 1 refills | Status: AC
Start: 2022-04-17 — End: ?

## 2022-04-17 NOTE — Progress Notes
[facility]ologyRosemarie Sherrie Mustache, MD Larwance Rote, MD Ruthann Cancer, MD Jonah Blue, APP  Lurlean Nanny, MD Andee Lineman, MD Anibal Henderson, MD  Tawana Scale, MD Alden Hipp, MD Brien Mates, APP  Consult Pre-colonoscopy appointment; pt is refusing Golytely. Wants different prep. Wants appointment to discussHPI:72 year old female with PMH HTN, HLD, T2DM presents today to discuss colonoscopy surveillance and prep options. Last colonoscopy in 2019, 1 polyp identified. Prep was excellent. Denies GI symptoms. Denies fevers, chills, N/V/D/C. No weight or appetite changes. No abdominal pain. JYN:WGNFAOZHYQMVHQ: Negative.HENT: Negative. Eyes: Negative. Respiratory: Negative for cough and shortness of breath. Cardiovascular: Negative for chest pain. Neurological: Negative for dizziness. Psychiatric/Behavioral: Negative for agitation.All other systems reviewed and are negative.PMHx:Past Medical History: Diagnosis Date  Bronchospasm   Diabetes mellitus (HC Code)   Diastolic dysfunction 12/2004  Dilated ventricle 12/2004  R, mildly on echo  Diverticula, colon 11/2004  scattered L-sided   Edema of lower extremity   History of depression   History of tobacco use   Hyperlipidemia   pt states she is not aware of hyperlipidema  Hypertension   Impaired glucose tolerance   LBP (low back pain)   Pulmonary HTN (HC Code)   Vitamin D deficiency  PSHx:Past Surgical History: Procedure Laterality Date  COLONOSCOPY  02/04/2004  normal exam except for a few left sided diverticuli  ECTOPIC PREGNANCY SURGERY    HYSTERECTOMY    partial, ovaries intact  PARATHYROIDECTOMY   Social IO:NGEXBM History Socioeconomic History  Marital status: Married   Spouse name: Not on file  Number of children: 6  Years of education: Not on file  Highest education level: Not on file Occupational History Occupation: Retired   Associate Professor: [employer] Tobacco Use  Smoking status: Former   Current packs/day: 0.00   Average packs/day: 1.5 packs/day for 6.0 years (9.0 ttl pk-yrs)   Types: Cigarettes   Start date: 02/04/1975   Quit date: 02/03/1981   Years since quitting: 41.2  Smokeless tobacco: Never Substance and Sexual Activity  Alcohol use: No  Drug use: No  Sexual activity: Yes   Partners: Male Other Topics Concern  Not on file Social History Narrative   Marital status: married   Living situation: lives with husband   Children: 4 biological, 1 adopted, 1 foster   Occupation: retired Retail buyer (retired 2010), volunteers at [employer]   Exercise: none lately, limited by LBP   Smoking: ex-smoker, quit >30 years ago, smoked 1.5 ppd x ~6 years   Alcohol: none   Caffeine: rare soda   Other substances / drugs: none   LMP or post-menopausal: likely post-menopausal, +hot-flashes   BSE: intermittently      Colonoscopy: 12/05/2004, recall 10 years	UTD / due 12/2014     Social Determinants of Health Financial Resource Strain: Not on file Food Insecurity: Not on file Transportation Needs: Not on file Physical Activity: Not on file Stress: Not on file Social Connections: Not on file Intimate Partner Violence: Not on file Housing Stability: Not on file Family WU:XLKGMW History Problem Relation Age of Onset  Hypertension Mother   Aneurysm Mother 47  Diabetes Sister   Cancer Sister       bone  Kidney disease Sister   Hypertension Father  Medications:Current Outpatient Medications on File Prior to Visit Medication Sig Dispense Refill  albuterol sulfate 90 mcg/actuation HFA aerosol inhaler Inhale 2 puffs into the lungs every 6 (six) hours as needed for wheezing. 18 g 3  amLODIPine (NORVASC) 10 mg tablet Take 1 tablet (10  mg total) by mouth daily. 90 tablet 4  aspirin 81 mg EC delayed release tablet Take 1 tablet (81 mg total) by mouth daily. 90 tablet 0  BLOOD GLUCOSE METER (ACCU-CHEK AVIVA PLUS METER) device Test daily 1 each 0  blood sugar diagnostic (ACCU-CHEK AVIVA PLUS TEST STRP) test strips Test once daily 100 each 3  cetirizine-pseudoephedrine (ZYRTEC-D) 5-120 mg per tablet Take 1 tablet by mouth 2 (two) times daily.. 10 tablet 1  cholecalciferol, vitamin D3, 25 mcg (1,000 unit) tablet Take 2 tablets (2,000 Units total) by mouth daily. 180 tablet 0  fluticasone propionate (FLONASE) 50 mcg/actuation nasal spray Use 2 sprays in each nostril daily. 16 g 11  ibuprofen (ADVIL,MOTRIN) 800 mg tablet Take 1 tablet (800 mg total) by mouth every 8 (eight) hours as needed (moderate to severe pain). 60 tablet 2  lancets (ACCU-CHEK SOFTCLIX LANCETS) Use as directed. 100 each 3  metFORMIN XR (GLUCOPHAGE-XR) 750 mg 24 hr tablet Take 2 tablets (1,500 mg total) by mouth daily with dinner. 180 tablet 3  pravastatin (PRAVACHOL) 40 mg tablet Take 1 tablet (40 mg total) by mouth daily. 90 tablet 3  semaglutide (OZEMPIC) 0.25 mg or 0.5 mg (2 mg/3 mL) pen injector Inject 0.25 mg under the skin every 7 days. 4.5 mL 0  telmisartan-hydrochlorothiazide (MICARDIS HCT) 80-25 mg per tablet Take 1 tablet by mouth daily. 90 tablet 3  traMADoL (ULTRAM) 50 mg tablet     triamcinolone (KENALOG) 0.5 % cream Apply topically 2 (two) times daily 30 g 0  TRUE METRIX LEVEL 2 Soln    No current facility-administered medications on file prior to visit. Allergies:Allergies Allergen Reactions  Sitagliptin Dizziness Physical Exam:Vitals: There were no vitals filed for this visit.Past Vitals: 2/2/20241459 Most Recent Value    Height: 5' 4 (1.626 m) 5' 4 (1.626 m)  as of 03/07/2022   Weight: 108.9 kg 108.9 kg  as of 03/07/2022   Body Mass Index:  41.20 kg/m? Abnormal 5' 4 (1.626 m)  as of 2/2/2024108.9 kg  as of 03/07/2022   BP: 110/90 Abnormal  110/90 Abnormal   as of 03/07/2022 General Appearance: Well appearing in NADNeuro: Alert and oriented x 3Labs: 03/12/22 BMP reviewed.12/2021 CBC reviewed. Imaging:N/aProcedures: 05/04/2017 ColonoscopyImpression:          - One 5 mm polyp at 30 cm proximal to the anus, removed                      with a jumbo cold forceps. Resected and retrieved.                      - Non-bleeding internal hemorrhoids.                      - The examination was otherwise normal. FINAL DIAGNOSIS COLON, 30 CM, POLYP, BIOPSY:       - BENIGN COLONIC MUCOSAL NEUROMA, SEE NOTE Note: The biopsy shows colonic lamina propria composed of hyperplastic spindle-shaped nerve fibers arranged in an irregularly ramifying manner. The spindle-shaped cells are positive for S100, and negative for EMA and Gut-1, supporting the above diagnosis. Assessment and Plan:  72 year old female with PMH HTN, HLD, T2DM presents today to discuss colonoscopy surveillance and prep options. Last colonoscopy in 2019, 1 polyp identified. Prep was excellent. She is well from GI standpoint. - Discussed risks and benefits of a colonoscopy procedure including but not limited to anesthesia complications, infection, bleeding and perforation requiring  surgical correction. Discussed prep protocol and the importance of compliance with colonoscopy prep in order to have adequate clean-out for procedure. Patient understands and agrees with plan, opted to proceed.  - Discussed prep options: Patient chose Sutab. Sent to pharamcy- Diabetes- meds per protocol, can discuss with prescribing physician - Continue ASA 81mg  PRN follow up Brien Mates, PA-C Physician AssistantDepartment of Digestive DiseasesYale School of Medicine Electronically Signed by Bubba Camp, PA-C, March 14, 2024VIDEO TELEHEALTH VISIT: This clinician is part of the telehealth program and is conducting this visit in a currently approved location. For this visit the clinician and patient were present via interactive audio & video telecommunications system that permits real-time communications, via the [employer].Patient's use of the telehealth platform followed consent and acknowledges agreement to permit telehealth for this visit. State patient is located in: [place] clinician is appropriately licensed in the above state to provide care for this visit. Other individuals present during the telehealth encounter and their role/relation: noneBecause this visit was completed over video, a hands-on physical exam was not performed. Patient/parent or guardian understands and knows to call back if condition changes.

## 2022-04-21 ENCOUNTER — Telehealth: Admit: 2022-04-21 | Payer: PRIVATE HEALTH INSURANCE | Primary: Internal Medicine

## 2022-04-21 NOTE — Telephone Encounter
Called patient to schedule colon and left VM. Called 1x

## 2022-04-21 NOTE — Telephone Encounter
Patient returning call to schedule colonoscopy, please return call to [phone]

## 2022-04-22 ENCOUNTER — Encounter: Admit: 2022-04-22 | Payer: PRIVATE HEALTH INSURANCE | Primary: Internal Medicine

## 2022-04-22 NOTE — Telephone Encounter
Returned patients call and scheduled her procedure.

## 2022-06-06 ENCOUNTER — Encounter: Admit: 2022-06-06 | Payer: PRIVATE HEALTH INSURANCE | Attending: Internal Medicine | Primary: Internal Medicine

## 2022-06-06 ENCOUNTER — Encounter: Admit: 2022-06-06 | Payer: Medicare (Managed Care) | Attending: Internal Medicine | Primary: Internal Medicine

## 2022-06-06 MED ORDER — CETIRIZINE 10 MG CHEWABLE TABLET
10 | Freq: Every day | ORAL | 1.00 refills | 90.00000 days | Status: AC | PRN
Start: 2022-06-06 — End: ?

## 2022-06-24 NOTE — Telephone Encounter
Patient called to verbally confirm procedure

## 2022-07-01 ENCOUNTER — Telehealth: Admit: 2022-07-01 | Payer: PRIVATE HEALTH INSURANCE | Primary: Internal Medicine

## 2022-07-01 NOTE — Telephone Encounter
Patient is calling to have the prescription for colonoscopy prep sent to [facility] on file, please contact patient once script has been sent. She can be reached at [phone]

## 2022-07-02 NOTE — Telephone Encounter
Telephone communication Returned patient call to [facility]; Patient is scheduled for Colonoscopy on 07/08/2022 at [facility]. Left voice message Sutab bowel preparation ready at [facility]. Left voice message to please return call to GI Navigation Department. Preferred Pharmacy; [id number], [place] - [address] AT [facility] OF [address], [place] [address]XBJYN: [phone]  Fax: [phone] #: [id number]

## 2022-07-04 ENCOUNTER — Encounter: Admit: 2022-07-04 | Payer: PRIVATE HEALTH INSURANCE | Attending: Internal Medicine | Primary: Internal Medicine

## 2022-07-04 ENCOUNTER — Ambulatory Visit: Admit: 2022-07-04 | Payer: PRIVATE HEALTH INSURANCE | Attending: Anesthesiology | Primary: Internal Medicine

## 2022-07-04 DIAGNOSIS — E785 Hyperlipidemia, unspecified: Secondary | ICD-10-CM

## 2022-07-04 DIAGNOSIS — M5137 Other intervertebral disc degeneration, lumbosacral region: Secondary | ICD-10-CM

## 2022-07-04 DIAGNOSIS — I10 Essential (primary) hypertension: Secondary | ICD-10-CM

## 2022-07-04 DIAGNOSIS — E119 Type 2 diabetes mellitus without complications: Secondary | ICD-10-CM

## 2022-07-04 DIAGNOSIS — I517 Cardiomegaly: Secondary | ICD-10-CM

## 2022-07-04 DIAGNOSIS — I5189 Other ill-defined heart diseases: Secondary | ICD-10-CM

## 2022-07-04 DIAGNOSIS — Z8659 Personal history of other mental and behavioral disorders: Secondary | ICD-10-CM

## 2022-07-04 DIAGNOSIS — R600 Localized edema: Secondary | ICD-10-CM

## 2022-07-04 DIAGNOSIS — M545 Low back pain: Secondary | ICD-10-CM

## 2022-07-04 DIAGNOSIS — K5730 Diverticulosis of large intestine without perforation or abscess without bleeding: Secondary | ICD-10-CM

## 2022-07-04 DIAGNOSIS — J9801 Acute bronchospasm: Secondary | ICD-10-CM

## 2022-07-04 DIAGNOSIS — R7302 Impaired glucose tolerance (oral): Secondary | ICD-10-CM

## 2022-07-04 DIAGNOSIS — E559 Vitamin D deficiency, unspecified: Secondary | ICD-10-CM

## 2022-07-04 DIAGNOSIS — E213 Hyperparathyroidism, unspecified: Secondary | ICD-10-CM

## 2022-07-04 DIAGNOSIS — Z87891 Personal history of nicotine dependence: Secondary | ICD-10-CM

## 2022-07-04 DIAGNOSIS — I272 Pulmonary hypertension, unspecified: Secondary | ICD-10-CM

## 2022-07-04 NOTE — Other
ReviewReview NP: Vernell Morgans

## 2022-07-04 NOTE — Telephone Encounter
Final attempt made to conduct pre-procedure call for Colonoscopy on 07/08/2022 at [facility]. No patient contact made. Left final voice message to return phone call to the GI Navigation Department. Chart sent for NP review.

## 2022-07-07 ENCOUNTER — Encounter: Admit: 2022-07-07 | Payer: PRIVATE HEALTH INSURANCE | Attending: Internal Medicine | Primary: Internal Medicine

## 2022-07-07 DIAGNOSIS — R7302 Impaired glucose tolerance (oral): Secondary | ICD-10-CM

## 2022-07-07 DIAGNOSIS — R600 Localized edema: Secondary | ICD-10-CM

## 2022-07-07 DIAGNOSIS — I5189 Other ill-defined heart diseases: Secondary | ICD-10-CM

## 2022-07-07 DIAGNOSIS — Z8659 Personal history of other mental and behavioral disorders: Secondary | ICD-10-CM

## 2022-07-07 DIAGNOSIS — M5137 Other intervertebral disc degeneration, lumbosacral region: Secondary | ICD-10-CM

## 2022-07-07 DIAGNOSIS — E079 Disorder of thyroid, unspecified: Secondary | ICD-10-CM

## 2022-07-07 DIAGNOSIS — E785 Hyperlipidemia, unspecified: Secondary | ICD-10-CM

## 2022-07-07 DIAGNOSIS — K5730 Diverticulosis of large intestine without perforation or abscess without bleeding: Secondary | ICD-10-CM

## 2022-07-07 DIAGNOSIS — I272 Pulmonary hypertension, unspecified: Secondary | ICD-10-CM

## 2022-07-07 DIAGNOSIS — I10 Essential (primary) hypertension: Secondary | ICD-10-CM

## 2022-07-07 DIAGNOSIS — E559 Vitamin D deficiency, unspecified: Secondary | ICD-10-CM

## 2022-07-07 DIAGNOSIS — M545 Low back pain: Secondary | ICD-10-CM

## 2022-07-07 DIAGNOSIS — I517 Cardiomegaly: Secondary | ICD-10-CM

## 2022-07-07 DIAGNOSIS — Z87891 Personal history of nicotine dependence: Secondary | ICD-10-CM

## 2022-07-07 DIAGNOSIS — E119 Type 2 diabetes mellitus without complications: Secondary | ICD-10-CM

## 2022-07-07 DIAGNOSIS — E213 Hyperparathyroidism, unspecified: Secondary | ICD-10-CM

## 2022-07-07 DIAGNOSIS — J9801 Acute bronchospasm: Secondary | ICD-10-CM

## 2022-07-07 MED ORDER — PEG 3350-ELECTROLYTES 236 GRAM-22.74 GRAM-6.74 GRAM-5.86 GRAM SOLUTION
236-22.74-6.74-5.86 -5.86 gram | Freq: Once | ORAL | 1 refills | Status: AC
Start: 2022-07-07 — End: 2022-07-08

## 2022-07-07 NOTE — Telephone Encounter
Telephone communication Returned patient call to [facility]; Patient reports able to locate Golytely bowel preparation at alternate [facility]. Patient verbalizes understanding and states appreciation. Alternate [facility]; [address] [place], [place] [address] ? [phone]

## 2022-07-07 NOTE — Telephone Encounter
FYI- pt is having trouble obtaining her bowel prep.Pt stated her pharmacy does not have her script in stock- she [facility] local [facility] and [facility] were out as well.Pt is [facility] way [facility] [place] and will see if she can get it there. Pt can be reached at [phone].

## 2022-07-07 NOTE — Telephone Encounter
Pt called back,, [facility] on [facility],,   per [facility] .  [facility]. Please call pt with which pharmacy you sending the Rx too .  When pt called in, we had a bad connection. I tried to call her back, it went to VM> #[phone]

## 2022-07-07 NOTE — Other
Patient is scheduled for Colonoscopy on 07/08/2022 at [facility]. Pre-procedure call performed. Discharge transportation policy & NPO instructions given per pre-procedure guidelines. Reviewed Golytely bowel preparation My Chart 04/22/2022. Patient instructed to take Albuterol prn, Flonase, cetirizine, with sip of water on the morning of scheduled procedure. Teach back method used. Patient verbalizes understanding.Patient confirms history of diabetes. Patient provided with instructions per the [facility] Pre-procedure Diabetes Protocol. Instructed patient to take Metformin by 5pm the evening before scheduled procedure. Teach back method used. Patient verbalizes understanding.Patient reports she does not want to take Sutab and requests Golytely sent to Preferred Pharmacy: [id number] [facility], [place] - [address] AT [facility] OF [address], [place] [address]GEXBM: [phone]  Fax: [phone] #: [id number]

## 2022-07-08 ENCOUNTER — Encounter: Admit: 2022-07-08 | Payer: PRIVATE HEALTH INSURANCE | Primary: Internal Medicine

## 2022-07-08 ENCOUNTER — Encounter: Admit: 2022-07-08 | Payer: PRIVATE HEALTH INSURANCE | Attending: Internal Medicine | Primary: Internal Medicine

## 2022-07-08 ENCOUNTER — Ambulatory Visit: Admit: 2022-07-08 | Payer: PRIVATE HEALTH INSURANCE | Attending: Anesthesiology | Primary: Internal Medicine

## 2022-07-08 ENCOUNTER — Inpatient Hospital Stay
Admit: 2022-07-08 | Discharge: 2022-07-08 | Payer: PRIVATE HEALTH INSURANCE | Attending: Internal Medicine | Primary: Internal Medicine

## 2022-07-08 DIAGNOSIS — Z6841 Body Mass Index (BMI) 40.0 and over, adult: Secondary | ICD-10-CM

## 2022-07-08 DIAGNOSIS — E213 Hyperparathyroidism, unspecified: Secondary | ICD-10-CM

## 2022-07-08 DIAGNOSIS — I10 Essential (primary) hypertension: Secondary | ICD-10-CM

## 2022-07-08 DIAGNOSIS — E785 Hyperlipidemia, unspecified: Secondary | ICD-10-CM

## 2022-07-08 DIAGNOSIS — Z90710 Acquired absence of both cervix and uterus: Secondary | ICD-10-CM

## 2022-07-08 DIAGNOSIS — E119 Type 2 diabetes mellitus without complications: Secondary | ICD-10-CM

## 2022-07-08 DIAGNOSIS — Z79899 Other long term (current) drug therapy: Secondary | ICD-10-CM

## 2022-07-08 DIAGNOSIS — I272 Pulmonary hypertension, unspecified: Secondary | ICD-10-CM

## 2022-07-08 DIAGNOSIS — R7302 Impaired glucose tolerance (oral): Secondary | ICD-10-CM

## 2022-07-08 DIAGNOSIS — D124 Benign neoplasm of descending colon: Secondary | ICD-10-CM

## 2022-07-08 DIAGNOSIS — Z87891 Personal history of nicotine dependence: Secondary | ICD-10-CM

## 2022-07-08 DIAGNOSIS — J9801 Acute bronchospasm: Secondary | ICD-10-CM

## 2022-07-08 DIAGNOSIS — I5189 Other ill-defined heart diseases: Secondary | ICD-10-CM

## 2022-07-08 DIAGNOSIS — Z86010 Personal history of colonic polyps: Secondary | ICD-10-CM

## 2022-07-08 DIAGNOSIS — M545 Low back pain: Secondary | ICD-10-CM

## 2022-07-08 DIAGNOSIS — Z800 Family history of malignant neoplasm of digestive organs: Secondary | ICD-10-CM

## 2022-07-08 DIAGNOSIS — Z8659 Personal history of other mental and behavioral disorders: Secondary | ICD-10-CM

## 2022-07-08 DIAGNOSIS — R600 Localized edema: Secondary | ICD-10-CM

## 2022-07-08 DIAGNOSIS — Z888 Allergy status to other drugs, medicaments and biological substances status: Secondary | ICD-10-CM

## 2022-07-08 DIAGNOSIS — J302 Other seasonal allergic rhinitis: Secondary | ICD-10-CM

## 2022-07-08 DIAGNOSIS — E7800 Pure hypercholesterolemia, unspecified: Secondary | ICD-10-CM

## 2022-07-08 DIAGNOSIS — Z7984 Long term (current) use of oral hypoglycemic drugs: Secondary | ICD-10-CM

## 2022-07-08 DIAGNOSIS — K5730 Diverticulosis of large intestine without perforation or abscess without bleeding: Secondary | ICD-10-CM

## 2022-07-08 DIAGNOSIS — I517 Cardiomegaly: Secondary | ICD-10-CM

## 2022-07-08 DIAGNOSIS — E559 Vitamin D deficiency, unspecified: Secondary | ICD-10-CM

## 2022-07-08 DIAGNOSIS — E079 Disorder of thyroid, unspecified: Secondary | ICD-10-CM

## 2022-07-08 DIAGNOSIS — Z808 Family history of malignant neoplasm of other organs or systems: Secondary | ICD-10-CM

## 2022-07-08 DIAGNOSIS — Z1211 Encounter for screening for malignant neoplasm of colon: Secondary | ICD-10-CM

## 2022-07-08 DIAGNOSIS — M5137 Other intervertebral disc degeneration, lumbosacral region: Secondary | ICD-10-CM

## 2022-07-08 MED ORDER — LACTATED RINGERS INTRAVENOUS SOLUTION
INTRAVENOUS | Status: DC
Start: 2022-07-08 — End: 2022-07-08
  Administered 2022-07-08: 14:00:00 1000.000 mL/h via INTRAVENOUS

## 2022-07-08 MED ORDER — PROPOFOL 10 MG/ML INTRAVENOUS EMULSION
10 mg/mL | INTRAVENOUS | Status: DC | PRN
Start: 2022-07-08 — End: 2022-07-08
  Administered 2022-07-08: 15:00:00 10 mg/mL via INTRAVENOUS

## 2022-07-08 MED ORDER — PROPOFOL 10 MG/ML INTRAVENOUS EMULSION
10 mg/mL | Status: DC | PRN
Start: 2022-07-08 — End: 2022-07-08
  Administered 2022-07-08: 15:00:00 10 mL/h

## 2022-07-08 MED ORDER — SODIUM CHLORIDE 0.9 % (FLUSH) INJECTION SYRINGE
0.9 % | INTRAVENOUS | Status: DC | PRN
Start: 2022-07-08 — End: 2022-07-08

## 2022-07-08 MED ORDER — LACTATED RINGERS INTRAVENOUS SOLUTION
INTRAVENOUS | Status: DC | PRN
Start: 2022-07-08 — End: 2022-07-08
  Administered 2022-07-08: 14:00:00 via INTRAVENOUS

## 2022-07-08 MED ORDER — SODIUM CHLORIDE 0.9 % (FLUSH) INJECTION SYRINGE
0.9 % | Freq: Three times a day (TID) | INTRAVENOUS | Status: DC
Start: 2022-07-08 — End: 2022-07-08

## 2022-07-08 NOTE — Anesthesia Post-Procedure Evaluation
Anesthesia Post-op NotePatient: Lori Ralphs E SorrellsProcedure(s):  Procedure(s) (LRB):COLONOSCOPY, FLEXIBLE; DX, W/WO SPECIMENS/COLON DECOMP (SEP PROC) (N/A) Last Vitals:  I have reviewed the post-operative vital signs as noted in the Epic chart.POSTOP EVALUATION:      Patient Recovery Location:  PACU     Vital Signs Status:  Stable     Patient Participation:  Patient participated     Mental Status:  Awake and alert     Respiratory Status:  Acceptable     Airway Patency:  Patent     Cardiovascular/Hydration Status:  Stable     Pain Management:  Satisfactory to patient     Nausea/Vomiting Status:  Satisfactory to patientNo notable events documented.

## 2022-07-08 NOTE — Progress Notes
This is a 72 y.o. female scheduled for COLONOSCOPY .COLONOSCOPY performed with specimens by cold snare and forceps

## 2022-07-08 NOTE — Other
Operative Diagnosis:Pre-op:   * No pre-op diagnosis entered * Patient Coded Diagnosis   Pre-op diagnosis: Screen for colon cancer  Post-op diagnosis: Screen for colon cancer  Patient Diagnosis   None    Post-op diagnosis:   * Screen for colon cancer [Z12.11]Operative Procedure(s) :Procedure(s) (LRB):COLONOSCOPY, FLEXIBLE; DX, W/WO SPECIMENS/COLON DECOMP (SEP PROC) (N/A)Post-op Procedure & Diagnosis ConfirmationPost-op Diagnosis: Post-op Diagnosis updated (see notes)     -  Screen for colon cancer [Z12.11]; colon polypsPost-op Procedure: Post-op Procedure updated (see notes)     - COLONOSCOPY, FLEXIBLE; DX, W/WO SPECIMENS/COLON DECOMP (SEP PROC)Anesthesia ClarifiersGI/Endoscopy: Planned Screening Colonoscopy - Procedure Performed (see above)

## 2022-07-08 NOTE — Anesthesia Pre-Procedure Evaluation
This is a 72 y.o. female scheduled for COLONOSCOPY, FLEXIBLE; DX, W/WO SPECIMENS/COLON DECOMP (SEP PROC).Review of Systems/ Medical HistoryPatient summary, nursing notes, EKG/Cardiac Studies , Labs, pre-procedure vitals, height, weight and NPO status reviewed.No previous anesthesia concernsAnesthesia Evaluation:   Estimated body mass index is 41.44 kg/m? as calculated from the following:  Height as of this encounter: 5' 4 (1.626 m).  Weight as of this encounter: 109.5 kg. CC/HPI: 72yo female with PMH  Morbid Obesity (BMI 42), HTN, HLD, DM2, colon polyp, Hyperparathyroidism, Who is scheduled for  COLONOSCOPY for screeningPast Surgical History:  HYSTERECTOMY	ECTOPIC PREGNANCY SURGERYCOLONOSCOPY	PARATHYROIDECTOMYCOLONOSCOPY	PAH 2021:Laryngoscope Size: McGraph and 3Grade View: Grade ITube type: OralTube size: 7.0 mmNumber of attempts: 1 Cardiovascular: Patient has a history of: hypercholesterolemia and hypertension.   -Other Cardiovascular:   Grade 1 diastolic dysfunction on echo (54/0981)XBJY pulmonary HTN on echo (12/2004)Mildly dilated R ventricle on echo (12/2004) . Respiratory:      -Nicotine Dependence: yes (former smoker - 9 pack years)Gastrointestinal/Genitourinary: -Gastrointestinal Disorders:  Patient has colon polyps.-Nutritional Disorders: Pt is obese per BMI definition-morbid obesity (BMI > 35 with comorbidities).Endocrine/Metabolic: -Diabetes mellitus:  Patient has diabetes mellitus type 2.-Thyroid Disorders:  Patient has hyperparathyroidism.Additional Findings: 06/06/22 14:42Sodium: 137Potassium: 4.3Chloride: 102CO2: 27BUN: 15Creatinine: 1.01Physical ExamCardiovascular:    normal exam  Rhythm: regularHeart Sounds: S1 present and S2 present.Pulmonary:  normal exam  Patient's breath sounds clear to auscultationAirway:  Mallampati: IITM distance: >3 FBNeck ROM: fullDental:  unremarkable  Anesthesia PlanASA 3 The primary anesthesia plan is  general. Perioperative Code Status confirmed: It is my understanding that the patient is currently designated as 'Full Code' and will remain so throughout the perioperative period.Anesthesia informed consent obtained. Consent obtained from: patientThe post operative pain plan is per surgeon management.Plan discussed with CRNA.Anesthesiologist's Pre Op NoteI personally evaluated and examined the patient prior to the intra-operative phase of care on the day of the procedure.Marland Kitchen

## 2022-07-08 NOTE — Other
Post Anesthesia Transfer of Care NotePatient: Ellene Marko SorrellsProcedure(s) Performed: Procedure(s) (LRB):COLONOSCOPY, FLEXIBLE; DX, W/WO SPECIMENS/COLON DECOMP (SEP PROC) (N/A)Last Vitals: I have reviewed the post-operative vital signs during the handoff as noted in the Epic chart.POSTOP HANDOFF :      Patient Location:  PACU     Level of Consciousness:  Sedated     VS stable since last recorded intra-op set? Yes       Oxygen source: maskPatient co-morbidities, intra-operative course, intake & output and antibiotics as per Anesthesia record were discussed with the RN.

## 2022-07-08 NOTE — Other
Gastroenterology History & PhysicalIndication for procedure: Screening  Procedure: ColonoscopyHistory of Present Illness: 72 yo F with PMH of depression, HLD, tobacco use, pHTN, DMII who p/f screening colonoscopy. Last colonoscopy 05/2017, one 5mm polyp removed, path: benign mucosal neuroma. The patient states this is their second colonoscopy. No FH of CRC. No GI symptoms including abdominal pain, unintentional weight loss, diarrhea, or bloody stools. No blood thinners apart from baby ASA, last yesterday. Review of Medical / Surgical / Family / Social History: Past Medical History: Diagnosis Date  Bronchospasm   Degeneration of lumbar or lumbosacral intervertebral disc   Diabetes mellitus (HC Code)   Diastolic dysfunction 12/04/2004  Dilated ventricle 12/04/2004  R, mildly on echo  Disease of thyroid gland   Diverticula, colon 11/03/2004  scattered L-sided   Edema of lower extremity   History of depression   History of tobacco use   Hyperlipidemia   Hyperparathyroidism (HC Code) 05/20/2019  s/p parathyroidectomy  Hypertension   Impaired glucose tolerance   Low back pain   Pulmonary HTN (HC Code)   Type 2 diabetes mellitus (HC Code)   Vitamin D deficiency  Past Medical History Pertinent Negatives: Diagnosis Date Noted  Asthma 07/07/2022  At risk for malignant hyperthermia 07/07/2022  Kidney stone 07/07/2022  Obstructive sleep apnea 07/07/2022  PONV (postoperative nausea and vomiting) 07/07/2022  Seizures (HC Code) 07/07/2022  Stroke (HC Code) 07/07/2022  Family History Problem Relation Age of Onset  Hypertension Mother   Aneurysm Mother 68      Cause of death  Colon cancer Father   Diabetes Sister   Cancer Sister       bone  Kidney disease Sister   No Known Problems Maternal Grandmother   No Known Problems Maternal Grandfather   No Known Problems Paternal Grandmother   No Known Problems Paternal Grandfather   Past Surgical History: Procedure Laterality Date  COLONOSCOPY  02/04/2004  normal exam except for a few left sided diverticuli  COLONOSCOPY  05/04/2017  ECTOPIC PREGNANCY SURGERY    HYSTERECTOMY    partial, ovaries intact  PARATHYROIDECTOMY  05/20/2019 No past surgical history pertinent negatives on file. Social History Tobacco Use  Smoking status: Former   Current packs/day: 0.00   Average packs/day: 1.5 packs/day for 6.0 years (9.0 ttl pk-yrs)   Types: Cigarettes   Start date: 02/04/1975   Quit date: 02/03/1981   Years since quitting: 41.4  Smokeless tobacco: Never Vaping Use  Vaping Use: Never used Substance Use Topics  Alcohol use: No  Drug use: No  Medications / Allergies: Medications:Home Medications:Current Medications Medication Sig Note  albuterol sulfate 90 mcg/actuation HFA aerosol inhaler Inhale 2 puffs into the lungs every 6 (six) hours as needed for wheezing.   amLODIPine (NORVASC) 10 mg tablet Take 1 tablet (10 mg total) by mouth daily. 07/07/2022: PM  aspirin 81 mg EC delayed release tablet Take 1 tablet (81 mg total) by mouth daily.   fluticasone propionate (FLONASE) 50 mcg/actuation nasal spray Use 2 sprays in each nostril daily.   ibuprofen (ADVIL,MOTRIN) 800 mg tablet Take 1 tablet (800 mg total) by mouth every 8 (eight) hours as needed (moderate to severe pain).   metFORMIN XR (GLUCOPHAGE-XR) 750 mg 24 hr tablet Take 2 tablets (1,500 mg total) by mouth daily with dinner. (Patient taking differently: Take 1 tablet (750 mg total) by mouth 2 (two) times daily with breakfast and dinner.)   pravastatin (PRAVACHOL) 40 mg tablet Take 1 tablet (40 mg total) by mouth daily. 07/07/2022: PM  telmisartan-hydrochlorothiazide (  MICARDIS HCT) 80-25 mg per tablet Take 1 tablet by mouth daily. 07/07/2022: AM  triamcinolone (KENALOG) 0.5 % cream Apply topically 2 (two) times daily   Allergies:Allergies Allergen Reactions  Seasonal Allergies Cough and Nasal Congestion  Sitagliptin Dizziness Objective: Vitals:Vitals:  07/08/22 0903 BP: (!) 166/74 Pulse: (!) (P) 54 Resp: 18 Temp: 97.9 ?F (36.6 ?C) SpO2: 99% Physical Exam:Gen: NAD, well-appearingHEENT: no scleral icterusLungs: Easy WOB on RAAbd: soft, ND / NTTPExt: no edemaSkin: no rashes, no jaundiceNeuro: alert and oriented x3, no focal deficitsAssessment / Recommendations: Risks and benefits were reviewed with patient and consent was obtained. Will proceed with procedure with possible interventions.Silva Bandy, MDYale Gastroenterology and Hepatology Fellow

## 2022-07-08 NOTE — Discharge Instructions
@  DEPTLOGO@Patient  Instructions after a ColonoscopyNo name on fileAvlin B Notnamed Scholz, MDActivityYou were given a medicine to help you go to sleep (anesthesia) for your colonoscopy.  This may continue to affect you for 24 hours, so please do not: Drive or use heavy machinery.Sign important papers or documents.Drink alcohol.You may slowly go back to your normal daily activities.  Take your medicines according to your doctor?s directions.Eating and DrinkingYou may start eating and drinking when you get home.  Start slowly with smaller portions to make sure the food does not upset your stomach.Drink a lot of fluids.SymptomsYou may have cramps or bloating after the procedure, this usually will go away in a few hours as you pass gas out of your system. Call us If You: Have blood with your bowel movement up to 2 weeks after your colonoscopy.Feel like you will throw up or are throwing up.Have a temperature over 100oF.Have severe pain in your belly.  Findings: 3 polyps removed today, repeat will depend on pathologyAdditional Comments:

## 2022-07-12 ENCOUNTER — Encounter: Admit: 2022-07-12 | Payer: PRIVATE HEALTH INSURANCE | Attending: Internal Medicine | Primary: Internal Medicine

## 2022-09-02 ENCOUNTER — Encounter
Admit: 2022-09-02 | Payer: PRIVATE HEALTH INSURANCE | Attending: Endocrinology, Diabetes & Metabolism | Primary: Internal Medicine

## 2022-09-02 ENCOUNTER — Ambulatory Visit
Admit: 2022-09-02 | Payer: PRIVATE HEALTH INSURANCE | Attending: Endocrinology, Diabetes & Metabolism | Primary: Internal Medicine

## 2022-09-02 DIAGNOSIS — M5137 Other intervertebral disc degeneration, lumbosacral region: Secondary | ICD-10-CM

## 2022-09-02 DIAGNOSIS — R7302 Impaired glucose tolerance (oral): Secondary | ICD-10-CM

## 2022-09-02 DIAGNOSIS — E210 Primary hyperparathyroidism: Secondary | ICD-10-CM

## 2022-09-02 DIAGNOSIS — I272 Pulmonary hypertension, unspecified: Secondary | ICD-10-CM

## 2022-09-02 DIAGNOSIS — I5189 Other ill-defined heart diseases: Secondary | ICD-10-CM

## 2022-09-02 DIAGNOSIS — E559 Vitamin D deficiency, unspecified: Secondary | ICD-10-CM

## 2022-09-02 DIAGNOSIS — I10 Essential (primary) hypertension: Secondary | ICD-10-CM

## 2022-09-02 DIAGNOSIS — E079 Disorder of thyroid, unspecified: Secondary | ICD-10-CM

## 2022-09-02 DIAGNOSIS — E785 Hyperlipidemia, unspecified: Secondary | ICD-10-CM

## 2022-09-02 DIAGNOSIS — E119 Type 2 diabetes mellitus without complications: Secondary | ICD-10-CM

## 2022-09-02 DIAGNOSIS — E213 Hyperparathyroidism, unspecified: Secondary | ICD-10-CM

## 2022-09-02 DIAGNOSIS — K5730 Diverticulosis of large intestine without perforation or abscess without bleeding: Secondary | ICD-10-CM

## 2022-09-02 DIAGNOSIS — E042 Nontoxic multinodular goiter: Secondary | ICD-10-CM

## 2022-09-02 DIAGNOSIS — J9801 Acute bronchospasm: Secondary | ICD-10-CM

## 2022-09-02 DIAGNOSIS — R600 Localized edema: Secondary | ICD-10-CM

## 2022-09-02 DIAGNOSIS — I517 Cardiomegaly: Secondary | ICD-10-CM

## 2022-09-02 DIAGNOSIS — Z87891 Personal history of nicotine dependence: Secondary | ICD-10-CM

## 2022-09-02 DIAGNOSIS — Z8659 Personal history of other mental and behavioral disorders: Secondary | ICD-10-CM

## 2022-09-02 DIAGNOSIS — M545 Low back pain: Secondary | ICD-10-CM

## 2022-09-02 NOTE — Patient Instructions
4D [place] of neck for parathyroid [phone] density at any [place] radiology location [phone] labs at [facility] No TUMS, no calcium pillsYou SHOULD continue cheese, yogurt, 3 servings of dairy per day is GREAT

## 2022-09-02 NOTE — Progress Notes
Initial Endocrinology Consultation - [facility] Referring Clinician: AsieduCC: hypercalcemia, hx of parathyroidectomyHPI: Lori Kerr is a 72 y.o. female here for an endocrinology consultation at the [facility] requested by Dr. Beatris Ship for hypercalcemia in setting of hx of primary hyperparathyroidism s/p parathyroidectomy in 2021.Lori Kerr has a history of DM2, HTN, HLD, multinodular goiter, primary hyperparathyroidism s/p PTxShe had parathyroidectomy in [facility] [place] in 05/20/2019 by surgeon Dr Duanne Guess. Per the OP note, she had an enlarged right superior parathyroid gland, descended within tracheoesophageal groove, enlarged multinodular thyroid, had appropriate drpp in intra-op PTH, Op note indicates 50% drop in PTH intraop   She was previously on Sensipar 30 mg daily for hypercalcemia in 2020 and did not tolerate GI side effects. Has always been on telmisartan-HCTZ combo even before the 2020 diagnosis of hyperparathyroidism. Her Ca was 12.7, PTH 73 in 2021 when diagnosedHer repeat Ca level off the HCTZ was 11 in 2021. Had normal SPEP, UPEP at that timeDXA at [facility] 2021 showed normal BMD at distal radius and hipReportedly had thyroid sono with multiple nodules -  3 had FNAs' that were reportedly benignNo polyuriaNo kidney stonesNo polydipsia Is on terlmisartan-HCTZ 80/25 Bone Health HistoryFalls or fractures: - had a toe fracture, after falling down the stairs, around age 59Kidney stones: noHeight loss:  noBack pain: has had ongoing back pain for many years, had surgery for it, and when stands long time has pressureSteroid Exposure: in past , never for than 2 weeks at a timeDietary calcium intake: Loves cheese, occ drinks milkVaries eg mac and cheese or grilled cheese ayDenies lactose intoleranceCalcium supplement: no TUMs or calcium supplements Separate Vitamin D supplement: one daily, does not know amountMVI: noExercise: was going to gym but husband had some heart problems so she stopped going to gymLast dental visit: 2024Dental procedures: dentist wants to pull several teeth, she has a tooth that is loose that needs to be fixedPrior bone medications:Name		Dates of treatment		Side effectsDoes not think soThe ROS has been reviewed and, except for where noted above, there are no reported changes in the following areas: constitutional, skin, eye, ENT, respiratory, CV, GI, GU, reproductive, MS, or neurological. Relevant History:Menstrual Hx: Age of menarche= 17Regular periods: yes but had hysterectomy Age of menopause= around 52Estrogen use: no HRTPMH:  has a past medical history of Bronchospasm, Degeneration of lumbar or lumbosacral intervertebral disc, Diabetes mellitus (HC Code), Diastolic dysfunction (12/04/2004), Dilated ventricle (12/04/2004), Disease of thyroid gland, Diverticula, colon (11/03/2004), Edema of lower extremity, History of depression, History of tobacco use, Hyperlipidemia, Hyperparathyroidism (HC Code) (05/20/2019), Hypertension, Impaired glucose tolerance, Low back pain, Pulmonary HTN (HC Code), Type 2 diabetes mellitus (HC Code), and Vitamin D deficiency.PSH:  has a past surgical history that includes Hysterectomy; Ectopic pregnancy surgery; Colonoscopy (02/04/2004); Parathyroidectomy (05/20/2019); and Colonoscopy (05/04/2017).Meds: Current Outpatient Medications:   albuterol sulfate 90 mcg/actuation HFA aerosol inhaler, Inhale 2 puffs into the lungs every 6 (six) hours as needed for wheezing., Disp: 18 g, Rfl: 3  amLODIPine (NORVASC) 10 mg tablet, Take 1 tablet (10 mg total) by mouth daily., Disp: 90 tablet, Rfl: 4  aspirin 81 mg EC delayed release tablet, Take 1 tablet (81 mg total) by mouth daily., Disp: 90 tablet, Rfl: 0  BLOOD GLUCOSE METER (ACCU-CHEK AVIVA PLUS METER) device, Test daily, Disp: 1 each, Rfl: 0  blood sugar diagnostic (ACCU-CHEK AVIVA PLUS TEST STRP) test strips, Test once daily, Disp: 100 each, Rfl: 3  cetirizine (ZYRTEC) 10 mg chewable tablet, Take 1 tablet (10  mg total) by mouth daily as needed for allergies., Disp: , Rfl:   fluticasone propionate (FLONASE) 50 mcg/actuation nasal spray, Use 2 sprays in each nostril daily., Disp: 16 g, Rfl: 11  ibuprofen (ADVIL,MOTRIN) 800 mg tablet, Take 1 tablet (800 mg total) by mouth every 8 (eight) hours as needed (moderate to severe pain)., Disp: 60 tablet, Rfl: 2  metFORMIN XR (GLUCOPHAGE-XR) 750 mg 24 hr tablet, Take 2 tablets (1,500 mg total) by mouth daily with dinner. (Patient taking differently: Take 1 tablet (750 mg total) by mouth 2 (two) times daily with breakfast and dinner.), Disp: 180 tablet, Rfl: 3  pravastatin (PRAVACHOL) 40 mg tablet, Take 1 tablet (40 mg total) by mouth daily., Disp: 90 tablet, Rfl: 3  telmisartan-hydrochlorothiazide (MICARDIS HCT) 80-25 mg per tablet, Take 1 tablet by mouth daily., Disp: 90 tablet, Rfl: 3  traMADoL (ULTRAM) 50 mg tablet, Take 1-2 tablets (50-100 mg total) by mouth every 6 (six) hours as needed., Disp: , Rfl:   cetirizine-pseudoephedrine (ZYRTEC-D) 5-120 mg per tablet, Take 1 tablet by mouth 2 (two) times daily.. 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 Body mass index is 40.9 kg/m?Marland KitchenWt Readings from Last 1 Encounters: 09/02/22 110.7 kg Ht Readings from Last 1 Encounters: 09/02/22 5' 4.76 (1.645 m) General: No acute distressHEENT: nodular thyroid, well healed mid line neck scar CV: Regular rate and rhythm, normal S1 and S2Pulm: Clear to auscultation bilaterally. No wheezes, rales, or rhonchi.Extremities: No edema, no pretibial tenderness to palpation, no tremorBack: No spinal tenderness to palpationGait: NormalLabs:Component    Latest Ref Rng 12/20/2021 03/07/2022 06/06/2022 Sodium    135 - 146 mmol/L 139  138  137  Potassium    3.5 - 5.3 mmol/L 4.1  4.0  4.3  Chloride    98 - 110 mmol/L 104  103  102  CO2    20 - 32 mmol/L 20  23  27   Anion Gap    7 - 17  15    Glucose    65 - 99 mg/dL 161 (H)  88  94  BUN    7 - 25 mg/dL 17  21  15   Creatinine    0.50 - 1.05 mg/dL 0.96  0.45  4.09  Calcium    8.6 - 10.4 mg/dL 81.1 (H)  91.4 (H)  78.2 (H)  BUN/Creatinine Ratio    6 - 22 (calc) 18.9  SEE NOTE:  SEE NOTE:  Total Protein    6.6 - 8.7 g/dL 6.4 (L)    Albumin    3.6 - 4.9 g/dL 4.0    Total Bilirubin    <=1.2 mg/dL 0.3    Alkaline Phosphatase    9 - 122 U/L 54    Alanine Aminotransferase (ALT)  10 - 35 U/L 19    Aspartate Aminotransferase (AST)    10 - 35 U/L 22    Globulin    2.3 - 3.5 g/dL 2.4    A/G Ratio    1.0 - 2.2  1.7    AST/ALT Ratio    Reference Range Not Established  1.2    eGFR (Creatinine)    >=60 mL/min/1.9m2 >60    eGFR (Creatinine)    > OR = 60 mL/min/1.18m2  69  60  Thyroid Stimulating Hormone, 3rd Gen.    See Comment ?IU/mL 0.621    Vitamin D 25-Hydroxy Total    See Comment ng/mL 49    Parathyroid Hormone, Intact    15.0 - 65.0 pg/mL 47.1     Legend:Imaging: - DXA - 02/2019 DUKESpine not scannedAssessment: Lori Kerr is a 72 y.o. woman with DM2, HTN, HLD, primary hyperparathyroidism s/p right superior parathyroidectomy in 2021Referred for hypercalcemia normal PTHCa around 10s-11However the combined telmisartan-hctz could be pushing the Ca into 10's range, though on the other hand, the hypercalcemia of 11 seems to be a bit generous just for thiazide effectThe thiazide could be contriubting to the hypercalcemia if indeed it is a recurrence of the hyperparaShe also has multinodular goiter s/p prior benign FNAsNeeds parathyroid and thyroid imagingHer DXA was normal in 2021, and this needs to be updated as wellRecommendations:#primary hyperpara s/p PTx#multinodular goiter- contact Dr Beatris Ship to see if can split into telmisartan and alternative diuretic, eg ?spironolactone?- 4D [place] neck- DXA including wrist- thyroid US- repeat labs- NO CALCIUM PILLS OR TUMS. Take 3 servings of calcium containing food from your diet- vitamin D continue for now - may need to LOWER this- discussed role for endocrine surgery consulation regarding re-op if truly recurrence - Return for follow up in 6-7 months, 2/2025Anika K. Lori Kerr, MDEndocrinologyOn the day of this patient's encounter, a total of 60 minutes was personally spent by me.  This does not include any resident/fellow teaching time, or any time spent performing a procedural service.

## 2022-09-03 DIAGNOSIS — E213 Hyperparathyroidism, unspecified: Secondary | ICD-10-CM

## 2022-09-05 ENCOUNTER — Encounter: Admit: 2022-09-05 | Payer: PRIVATE HEALTH INSURANCE | Attending: Internal Medicine | Primary: Internal Medicine

## 2022-09-10 ENCOUNTER — Encounter: Admit: 2022-09-10 | Payer: PRIVATE HEALTH INSURANCE | Attending: Internal Medicine | Primary: Internal Medicine

## 2022-09-10 ENCOUNTER — Encounter: Admit: 2022-09-10 | Payer: Medicare (Managed Care) | Attending: Internal Medicine | Primary: Internal Medicine

## 2022-09-10 MED ORDER — CETIRIZINE 5 MG-PSEUDOEPHEDRINE ER 120 MG TABLET,EXTENDED RELEASE,12HR
5-120 | ORAL_TABLET | Freq: Two times a day (BID) | ORAL | 1 refills | 8.00000 days | Status: AC
Start: 2022-09-10 — End: 2022-10-20

## 2022-09-10 MED ORDER — TELMISARTAN 80 MG TABLET
80 | ORAL_TABLET | Freq: Every day | ORAL | 5 refills | 90.00000 days | Status: AC
Start: 2022-09-10 — End: ?

## 2022-09-10 MED ORDER — FLUTICASONE PROPIONATE 50 MCG/ACTUATION NASAL SPRAY,SUSPENSION
50 | Freq: Every day | NASAL | 12 refills | 30.00000 days | Status: AC
Start: 2022-09-10 — End: ?

## 2022-09-16 LAB — PTH, INTACT AND CALCIUM
CALCIUM: 9.7 mg/dL (ref 8.6–10.4)
PARATHYROID HORMONE INTACT: 64 pg/mL (ref 16–77)

## 2022-09-16 LAB — ALBUMIN: ALBUMIN: 4.3 g/dL (ref 3.6–5.1)

## 2022-09-16 LAB — VITAMIN D, 25-HYDROXY: VITAMIN D, 25 OH, TOTAL: 32 ng/mL (ref 30–100)

## 2022-09-16 LAB — PHOSPHATE (AS PHOSPHORUS)     (Q): PHOSPHATE (AS PHOSPHORUS): 3.5 mg/dL (ref 2.1–4.3)

## 2022-09-16 LAB — CREATININE/EGFR     (GH LMW Q YH)
CREATININE: 0.94 mg/dL (ref 0.50–1.05)
EGFR, CREATININE (CKD-EPI 2021) QUEST: 66 mL/min/{1.73_m2} (ref >=60)

## 2022-09-16 LAB — TSH W/REFLEX TO FT4     (BH GH LMW Q YH): TSH, 3RD GENERATION W/ REFLEX TO FT4: 1.21 mIU/L (ref 0.40–4.50)

## 2022-09-17 ENCOUNTER — Encounter: Admit: 2022-09-17 | Payer: PRIVATE HEALTH INSURANCE | Attending: Internal Medicine | Primary: Internal Medicine

## 2022-10-01 ENCOUNTER — Encounter
Admit: 2022-10-01 | Payer: PRIVATE HEALTH INSURANCE | Attending: Endocrinology, Diabetes & Metabolism | Primary: Internal Medicine

## 2022-10-01 NOTE — Other
PTH and Ca normal nowHas been taken off the HCTZ component of anti hypertensive Advised pt to schedule the DXA, 4D Echo, and thyroid USFollow up with me in 03/2023 as scheduled

## 2022-10-08 ENCOUNTER — Encounter: Admit: 2022-10-08 | Payer: PRIVATE HEALTH INSURANCE | Attending: Internal Medicine | Primary: Internal Medicine

## 2022-10-08 ENCOUNTER — Encounter: Admit: 2022-10-08 | Payer: Medicare (Managed Care) | Attending: Internal Medicine | Primary: Internal Medicine

## 2022-10-13 ENCOUNTER — Ambulatory Visit
Admit: 2022-10-13 | Payer: PRIVATE HEALTH INSURANCE | Attending: Vascular and Interventional Radiology | Primary: Internal Medicine

## 2022-10-13 DIAGNOSIS — J310 Chronic rhinitis: Secondary | ICD-10-CM

## 2022-10-13 DIAGNOSIS — H6992 Unspecified Eustachian tube disorder, left ear: Secondary | ICD-10-CM

## 2022-10-13 DIAGNOSIS — J3489 Other specified disorders of nose and nasal sinuses: Secondary | ICD-10-CM

## 2022-10-13 MED ORDER — IPRATROPIUM BROMIDE 42 MCG (0.06 %) NASAL SPRAY
42 | Freq: Three times a day (TID) | NASAL | 13 refills | Status: AC
Start: 2022-10-13 — End: ?

## 2022-10-15 IMAGING — US US ABDOMEN LIMITED
1 series · 14 of 25 positions shown · non-contrast
Comparison: CT abdomen pelvis 03/27/2020

CLINICAL DATA: Abdominal pain and bloating. Right upper quadrant
pain

EXAM:
ULTRASOUND ABDOMEN LIMITED RIGHT UPPER QUADRANT

[Series 1: us abdomen limited ruq (liver/gb) · 14 of 38 slices shown]
[im 1/38]
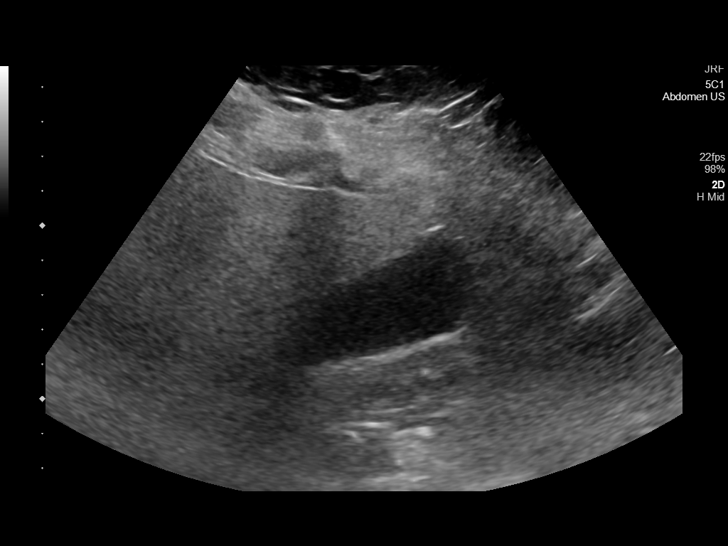
[im 4/38]
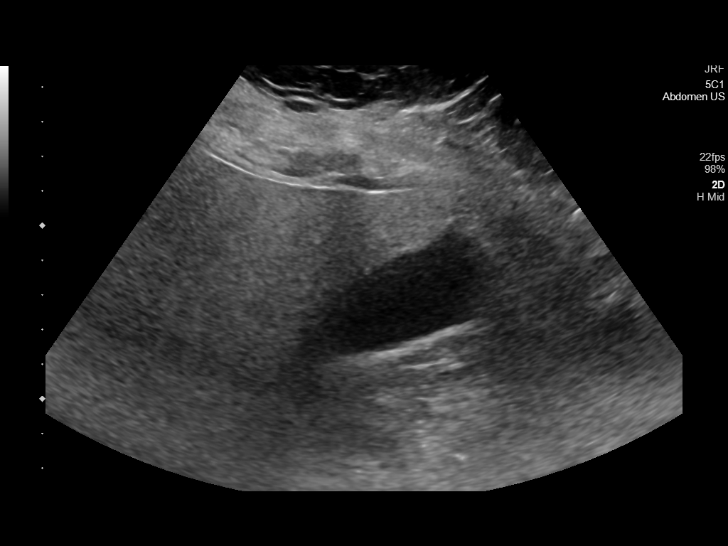
[im 7/38]
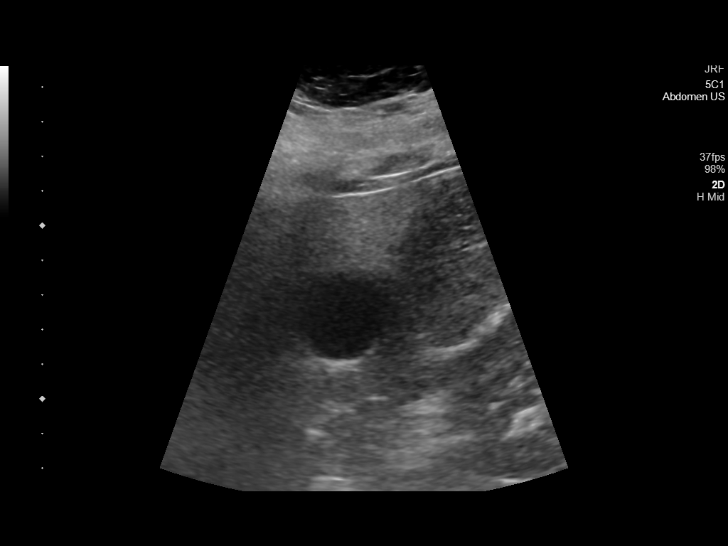
[im 10/38]
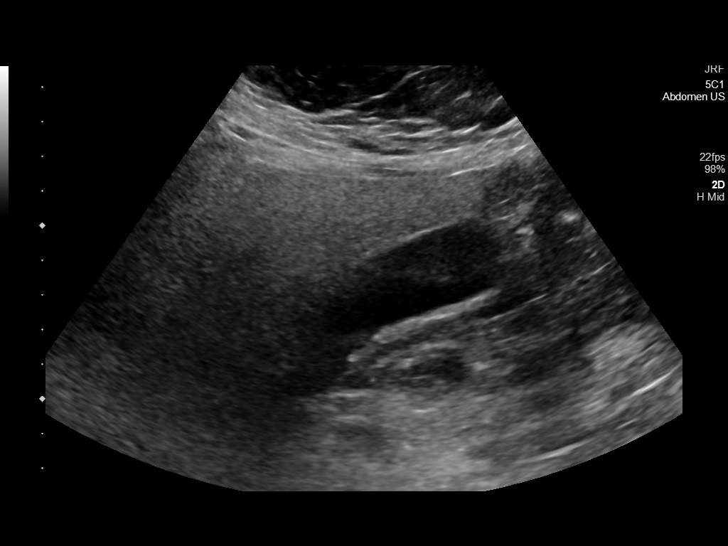
[im 13/38]
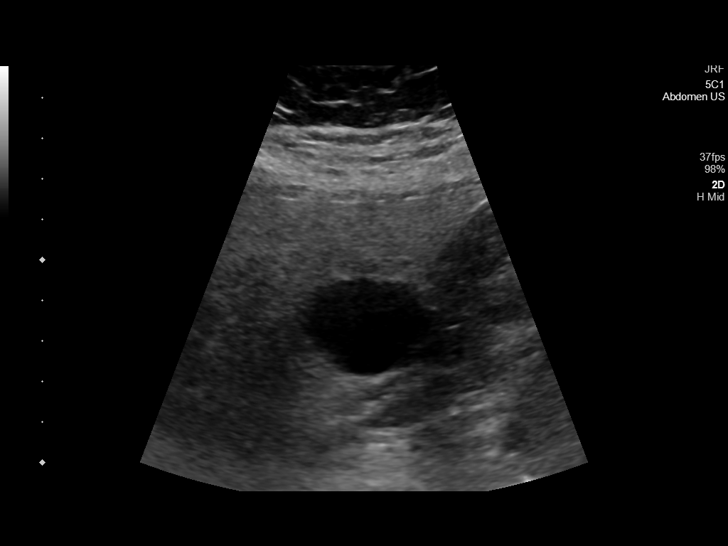
[im 14/38]
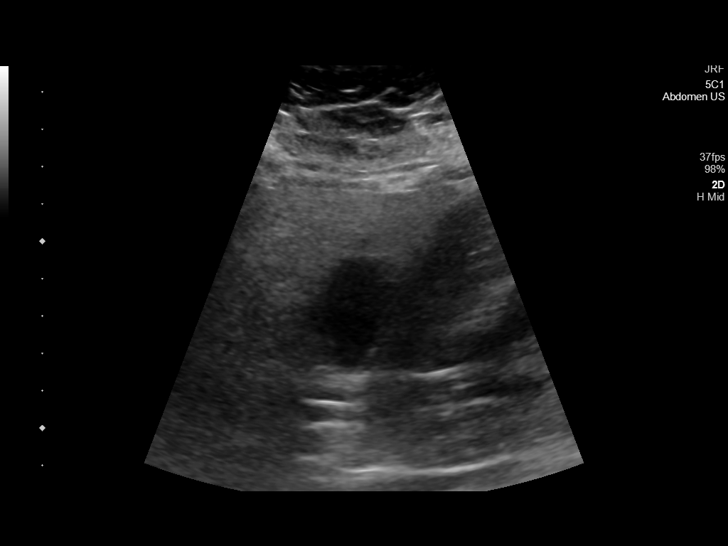
[im 17/38]
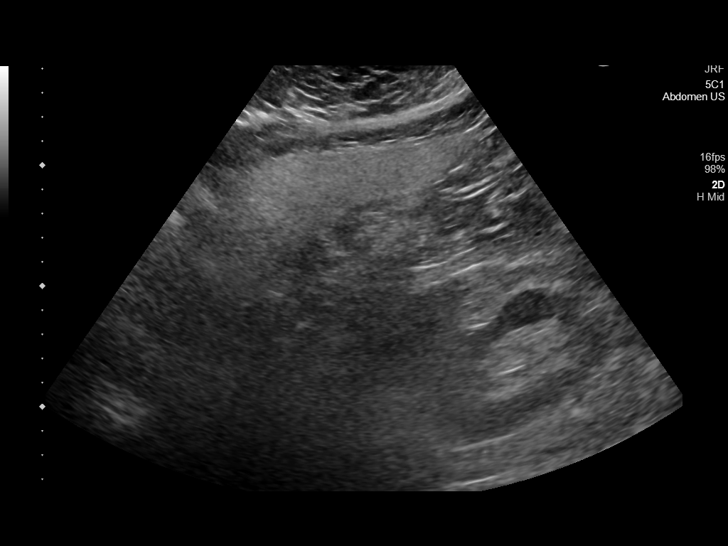
[im 21/38]
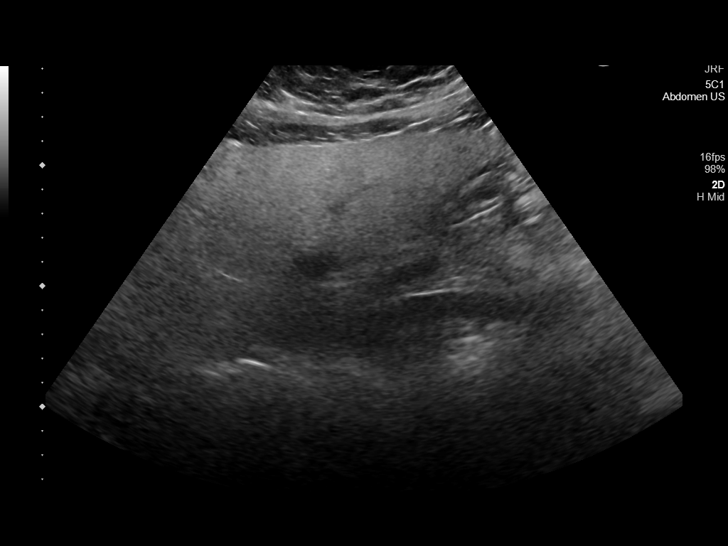
[im 24/38]
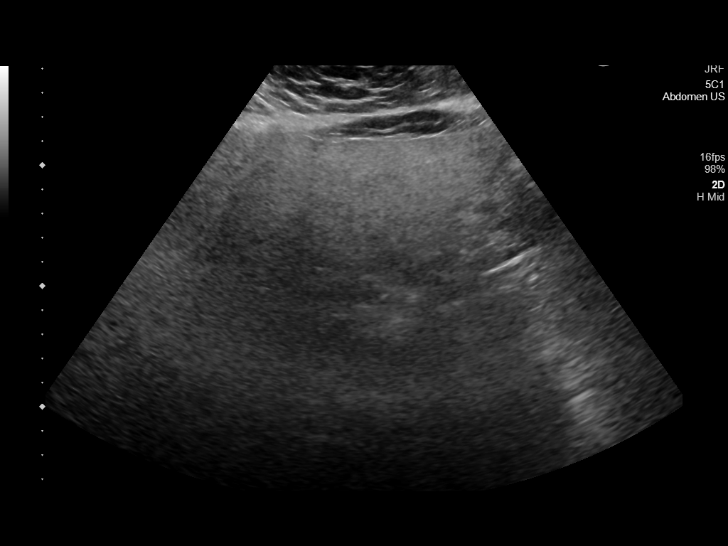
[im 25/38]
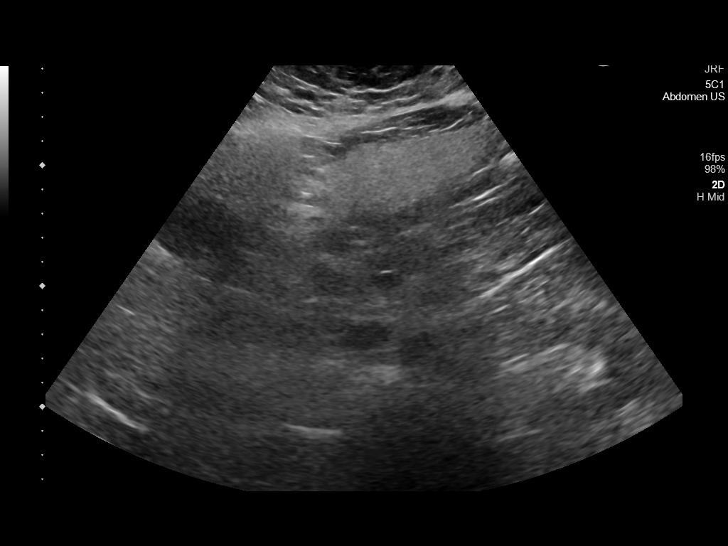
[im 28/38]
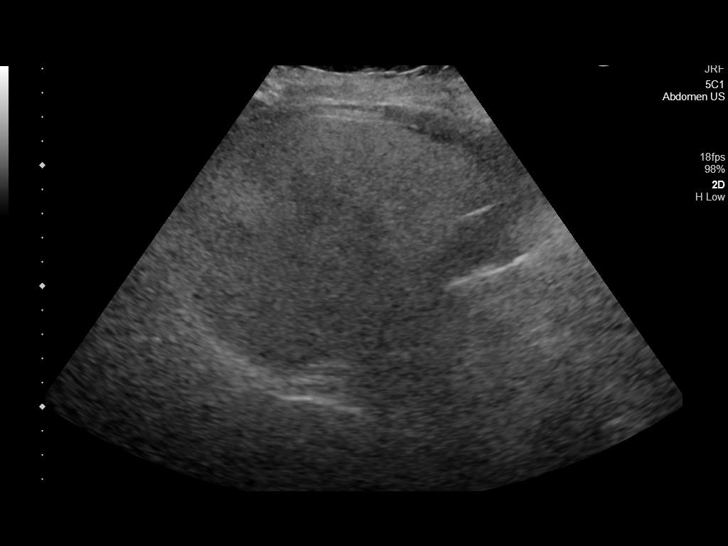
[im 31/38]
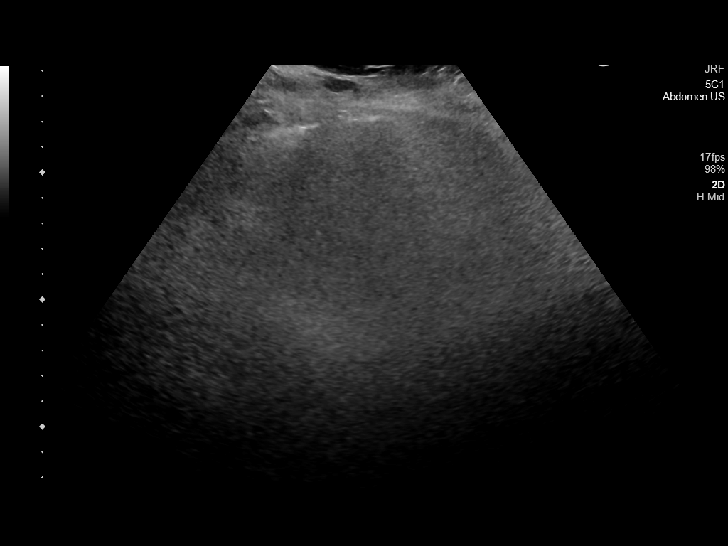
[im 34/38]
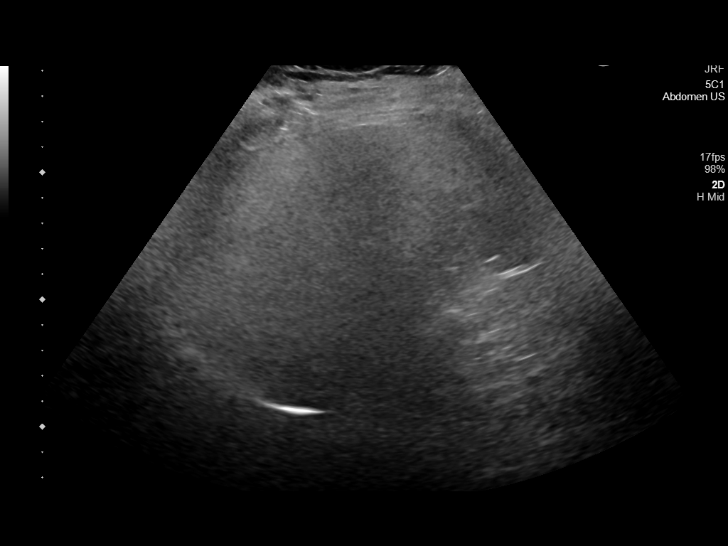
[im 38/38]
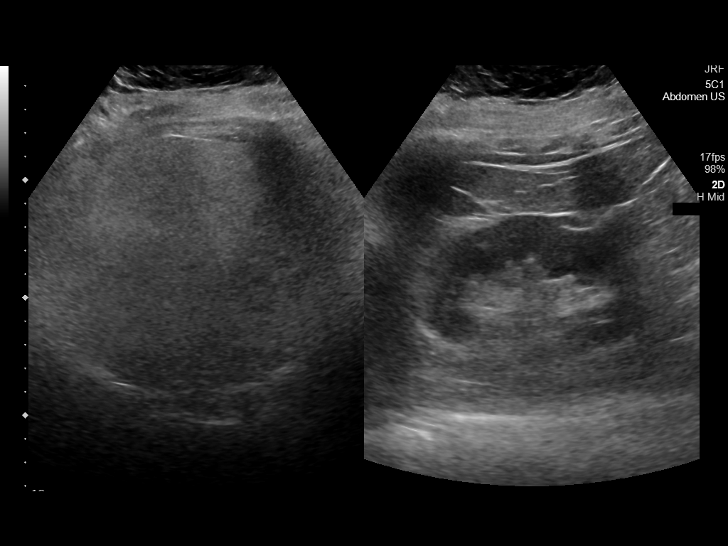

[14 of 25 positions shown; findings below may reference images not displayed]

FINDINGS: Gallbladder:

No gallstones or wall thickening visualized. No sonographic Murphy
sign noted by sonographer.

Common bile duct:

Diameter: 4.3 mm

Liver:

Increased echogenicity liver diffusely. Liver is difficult to image
by ultrasound. No focal liver lesion. Portal vein is patent on color
Doppler imaging with normal direction of blood flow towards the
liver.

Other: Negative for ascites
IMPRESSION: Very echogenic liver consistent with fatty infiltration/chronic
liver disease.

No biliary abnormality.

## 2022-10-19 NOTE — Progress Notes
 It is my pleasure to see your patient Lori Kerr whose office note is provided below.HPI: Lori Kerr is referred by Dr Sherolyn Buba for consultation regarding evaluation of rhinorrhea Lori Kerr is a 72 y.o. female, new patient to our office, presenting with rhinorrhea.  Anytime she bends over or rolls in bed she has significant runny nose.  She has to often sleep with nasal tissue or a towel.  She denies any nasal trauma or surgery to her nose. She feels she can breath well through her nose. She has never had any imaging.  She thinks she has allergies but never had testing. She feels she can smell and taste.  She denies recurrent sinus infections and post nasal drip. She has an occasional headache.  She has tried flonase with little improvement.  She has also tried an occasional antihistamine with little improvement.  The patient?s complete system review, past medical, family, and social history have been reviewed and are as noted in the electronic record and below.Past Medical History: has a past medical history of Bronchospasm, Degeneration of lumbar or lumbosacral intervertebral disc, Diabetes mellitus (HC Code), Diastolic dysfunction (12/04/2004), Dilated ventricle (12/04/2004), Disease of thyroid gland, Diverticula, colon (11/03/2004), Edema of lower extremity, History of depression, History of tobacco use, Hyperlipidemia, Hyperparathyroidism (HC Code) (05/20/2019), Hypertension, Impaired glucose tolerance, Low back pain, Pulmonary HTN (HC Code), Type 2 diabetes mellitus (HC Code), and Vitamin D deficiency.Past Surgical History: has a past surgical history that includes Hysterectomy; Ectopic pregnancy surgery; Colonoscopy (02/04/2004); Parathyroidectomy (05/20/2019); and Colonoscopy (05/04/2017).Social History:  reports that she quit smoking about 41 years ago. Her smoking use included cigarettes. She started smoking about 47 years ago. She has a 9.0 pack-year smoking history. She has never used smokeless tobacco. She reports that she does not drink alcohol and does not use drugs.Family History:Family History Problem Relation Age of Onset  Hypertension Mother   Aneurysm Mother 73      Cause of death  Colon cancer Father   Diabetes Sister   Cancer Sister       bone  Kidney disease Sister   No Known Problems Maternal Grandmother   No Known Problems Maternal Grandfather   No Known Problems Paternal Grandmother   No Known Problems Paternal Grandfather   Medication:Outpatient Encounter Medications as of 10/13/2022 Medication Sig Dispense Refill  albuterol sulfate 90 mcg/actuation HFA aerosol inhaler Inhale 2 puffs into the lungs every 6 (six) hours as needed for wheezing. 18 g 3  amLODIPine (NORVASC) 10 mg tablet Take 1 tablet (10 mg total) by mouth daily. 90 tablet 4  aspirin 81 mg EC delayed release tablet Take 1 tablet (81 mg total) by mouth daily. 90 tablet 0  BLOOD GLUCOSE METER (ACCU-CHEK AVIVA PLUS METER) device Test daily 1 each 0  blood sugar diagnostic (ACCU-CHEK AVIVA PLUS TEST STRP) test strips Test once daily 100 each 3  cetirizine (ZYRTEC) 10 mg chewable tablet Take 1 tablet (10 mg total) by mouth daily as needed for allergies.    cetirizine-pseudoephedrine (ZYRTEC-D) 5-120 mg per tablet Take 1 tablet by mouth 2 (two) times daily. 14 tablet 0  fluticasone propionate (FLONASE) 50 mcg/actuation nasal spray Use 2 sprays in each nostril daily. 16 g 11  ibuprofen (ADVIL,MOTRIN) 800 mg tablet Take 1 tablet (800 mg total) by mouth every 8 (eight) hours as needed (moderate to severe pain). 60 tablet 2  ipratropium (ATROVENT) 42 mcg (0.06 %) nasal spray Use 2 sprays in each nostril 3 (three) times daily. 15 mL 12  metFORMIN XR (GLUCOPHAGE-XR) 750 mg 24 hr tablet Take 2 tablets (1,500 mg total) by mouth daily with dinner. (Patient taking differently: Take 1 tablet (750 mg total) by mouth 2 (two) times daily with breakfast and dinner.) 180 tablet 3  pravastatin (PRAVACHOL) 40 mg tablet Take 1 tablet (40 mg total) by mouth daily. 90 tablet 3  telmisartan (MICARDIS) 80 mg tablet Take 1 tablet (80 mg total) by mouth daily. 90 tablet 4  traMADoL (ULTRAM) 50 mg tablet Take 1-2 tablets (50-100 mg total) by mouth every 6 (six) hours as needed.    triamcinolone (KENALOG) 0.5 % cream Apply topically 2 (two) times daily 30 g 0 No facility-administered encounter medications on file as of 10/13/2022.  Allergies:Allergies Allergen Reactions  Seasonal Allergies Cough and Nasal Congestion  Sitagliptin Dizziness  PHYSICAL EXAM: Constitutional:  The patient is well-developed and well-nourished in no acute distress.General: Normal voiceHead and Face:  Normocephalic, atraumatic without evidence of lesions. Face symmetric, no maxillary/frontal sinus tenderness upon palpationEyes:  Extraocular movements were intact. Conjunctivae are normal. Pupils are equal and round. Ears: AD: The external ear is well-developed. The external auditory canal is patent. The tympanic membrane is intact without evidence of perforation or effusion. AS: The external ear is well-developed. The external auditory canal is patent. The tympanic membrane is intact without evidence of perforation or effusionTuning Forks: Weber midline.  Rinne tuning fork test normal (AC> BC).  Finger rub test is bilaterally normal. Nose: The external nose appears normal.  Septum is midline. Turbinates are 2+. OC/Oropharynx: Good dentition. Moist oral mucosa.  The floor of mouth and oral tongue are soft. The tongue is fully mobile. Uvula is midline.  Tonsils are Pharynx/Larynx:  Pharynx normal, no lesions notedNeck: The neck is soft and supple. There is no tenderness over the thyrohyoid area. There are no thyroid masses. There are no parotid masses and no submandibular salivary gland masses. No lymphadenopathyNeurologic: Cranial nerves II-XII were grossly intact bilaterallyPsychiatric: Affect normalRespiratory: Symmetric chest expansion, no stridor, no retractions.CV: RRRBalance Tests:  Smooth pursuit and saccades normal. Finger to nose is normal. Romberg is normal. [place] was performed and revealed no turning tendency.  Cerebellar testing revealed no ataxia. Patient has a normal gait. Head Thrust is Normal.  Dix-Hallpike is negative bilaterally.  Head Shake did not reveal any nystagmus after head shake.Rigid Nasal Endoscopy (CPT Code 31231)After topical decongestant and anesthesia with Afrin and lidocaine sprays, a rigid nasal endoscopy was performed. In the left nasal cavity, the middle meatus, superior meatus, sphenoethmoid recess and olfactory cleft are clear. The turbinates are normal. No polyps or pus are seen. In the right nasal cavity, the middle meatus, superior meatus, sphenoethmoid recess and olfactory cleft are clear. No polyps or pus are seen. The turbinates are normal.Diagnosis:1. Chronic rhinitis  Allergen, respiratory panel (BH GH LMW YH)  2. Dysfunction of left eustachian tube    3. Rhinorrhea      Assessment/Plan:Rhinorrhea I recommended that we trial use of ipratropium bromide to see if this will help improve the frequency of rhinorrhea. Will also send for RAST Testing If persistent I would recommend that we consider imaging such as [place] sinus and to see if we can collect rhinorrhea and send out for CSF testing. Return in 3-4 weeks to see how she is doing, if no change will proceed with further testing.  The patient seemed to be in agreement and will contact me for persistent or progressive symptoms or return in follow-up.30  minute encounterElectronically Signed by Dayna Barker  Nettie [place], [place]

## 2022-10-19 NOTE — Progress Notes
 The following is the transcribed Review of Systems completed by the patient at intake.Review of Systems HENT:      Runny nose

## 2022-10-20 ENCOUNTER — Encounter: Admit: 2022-10-20 | Payer: Medicare (Managed Care) | Attending: Internal Medicine | Primary: Internal Medicine

## 2022-10-20 MED ORDER — CETIRIZINE 5 MG-PSEUDOEPHEDRINE ER 120 MG TABLET,EXTENDED RELEASE,12HR
5-120 | ORAL_TABLET | Freq: Two times a day (BID) | ORAL | 1 refills | 8.00000 days | Status: AC
Start: 2022-10-20 — End: ?

## 2022-10-20 MED ORDER — BENZONATATE 100 MG CAPSULE
100 | ORAL_CAPSULE | Freq: Three times a day (TID) | ORAL | 1 refills | 7.00000 days | Status: AC | PRN
Start: 2022-10-20 — End: ?

## 2022-10-20 MED ORDER — AZITHROMYCIN 250 MG TABLET
250 | ORAL_TABLET | ORAL | 1 refills | 5.00000 days | Status: AC
Start: 2022-10-20 — End: 2023-02-25

## 2022-10-23 ENCOUNTER — Telehealth: Admit: 2022-10-23 | Payer: PRIVATE HEALTH INSURANCE | Attending: Internal Medicine | Primary: Internal Medicine

## 2022-10-23 ENCOUNTER — Encounter: Admit: 2022-10-23 | Payer: PRIVATE HEALTH INSURANCE | Attending: Internal Medicine | Primary: Internal Medicine

## 2022-10-23 MED ORDER — CODEINE 10 MG-GUAIFENESIN 100 MG/5 ML ORAL LIQUID
10-100 | Freq: Three times a day (TID) | ORAL | 1 refills | 8.00000 days | Status: AC | PRN
Start: 2022-10-23 — End: 2023-02-25

## 2022-11-06 ENCOUNTER — Telehealth
Admit: 2022-11-06 | Payer: PRIVATE HEALTH INSURANCE | Attending: Vascular and Interventional Radiology | Primary: Internal Medicine

## 2022-11-06 NOTE — Telephone Encounter
 LVM for pt informing her that she was suppose to have a 1 mo f/u with Annice Pih in October. I believe the day I originally offered her Annice Pih has closed clinic. I asked that she give [place] a call back so we can see if she can come in on 10/4 or 10/7. Okay to db during clinic or add to the end of clinic on Friday. Thanks

## 2022-11-07 ENCOUNTER — Ambulatory Visit
Admit: 2022-11-07 | Payer: PRIVATE HEALTH INSURANCE | Attending: Vascular and Interventional Radiology | Primary: Internal Medicine

## 2022-11-07 ENCOUNTER — Inpatient Hospital Stay: Admit: 2022-11-07 | Discharge: 2022-11-07 | Payer: PRIVATE HEALTH INSURANCE | Primary: Internal Medicine

## 2022-11-07 ENCOUNTER — Encounter
Admit: 2022-11-07 | Payer: PRIVATE HEALTH INSURANCE | Attending: Vascular and Interventional Radiology | Primary: Internal Medicine

## 2022-11-07 DIAGNOSIS — K5730 Diverticulosis of large intestine without perforation or abscess without bleeding: Secondary | ICD-10-CM

## 2022-11-07 DIAGNOSIS — E785 Hyperlipidemia, unspecified: Secondary | ICD-10-CM

## 2022-11-07 DIAGNOSIS — R600 Localized edema: Secondary | ICD-10-CM

## 2022-11-07 DIAGNOSIS — R7302 Impaired glucose tolerance (oral): Secondary | ICD-10-CM

## 2022-11-07 DIAGNOSIS — E213 Hyperparathyroidism, unspecified: Secondary | ICD-10-CM

## 2022-11-07 DIAGNOSIS — J9801 Acute bronchospasm: Secondary | ICD-10-CM

## 2022-11-07 DIAGNOSIS — Z8659 Personal history of other mental and behavioral disorders: Secondary | ICD-10-CM

## 2022-11-07 DIAGNOSIS — E079 Disorder of thyroid, unspecified: Secondary | ICD-10-CM

## 2022-11-07 DIAGNOSIS — I272 Pulmonary hypertension, unspecified: Secondary | ICD-10-CM

## 2022-11-07 DIAGNOSIS — I5189 Other ill-defined heart diseases: Secondary | ICD-10-CM

## 2022-11-07 DIAGNOSIS — M545 Low back pain: Secondary | ICD-10-CM

## 2022-11-07 DIAGNOSIS — E559 Vitamin D deficiency, unspecified: Secondary | ICD-10-CM

## 2022-11-07 DIAGNOSIS — J3489 Other specified disorders of nose and nasal sinuses: Secondary | ICD-10-CM

## 2022-11-07 DIAGNOSIS — I517 Cardiomegaly: Secondary | ICD-10-CM

## 2022-11-07 DIAGNOSIS — J310 Chronic rhinitis: Secondary | ICD-10-CM

## 2022-11-07 DIAGNOSIS — I10 Essential (primary) hypertension: Secondary | ICD-10-CM

## 2022-11-07 DIAGNOSIS — J300 Vasomotor rhinitis: Secondary | ICD-10-CM

## 2022-11-07 DIAGNOSIS — Z87891 Personal history of nicotine dependence: Secondary | ICD-10-CM

## 2022-11-07 DIAGNOSIS — E119 Type 2 diabetes mellitus without complications: Secondary | ICD-10-CM

## 2022-11-07 DIAGNOSIS — M51379 Degeneration of lumbar or lumbosacral intervertebral disc: Secondary | ICD-10-CM

## 2022-11-09 NOTE — Progress Notes
 It is my pleasure to see your patient Lori Kerr whose office note is provided below.HPI: Lori Kerr is referred by Dr Sherolyn Buba for consultation regarding evaluation of rhinorrhea Lori Kerr is a 72 y.o. female, established patient to our office, presenting for follow up of her rhinorrhea.  She has found that with use of ipratroprium bromide. If she uses the nasal spray daily she notices that her nasal congestion has improved.  She no longer needs to sleep with with nasal tissue or a towel to help with the nasal drainage. She thinks she has allergies but never had testing. She feels she can smell and taste.  She denies recurrent sinus infections and post nasal drip. She has an occasional headache.  She has tried flonase with little improvement.  She has also tried an occasional antihistamine with little improvement.  The patient?s complete system review, past medical, family, and social history have been reviewed and are as noted in the electronic record and below.Past Medical History: has a past medical history of Bronchospasm, Degeneration of lumbar or lumbosacral intervertebral disc, Diabetes mellitus (HC Code), Diastolic dysfunction (12/04/2004), Dilated ventricle (12/04/2004), Disease of thyroid gland, Diverticula, colon (11/03/2004), Edema of lower extremity, History of depression, History of tobacco use, Hyperlipidemia, Hyperparathyroidism (HC Code) (05/20/2019), Hypertension, Impaired glucose tolerance, Low back pain, Pulmonary HTN (HC Code), Type 2 diabetes mellitus (HC Code), and Vitamin D deficiency.Past Surgical History: has a past surgical history that includes Hysterectomy; Ectopic pregnancy surgery; Colonoscopy (02/04/2004); Parathyroidectomy (05/20/2019); and Colonoscopy (05/04/2017).Social History:  reports that she quit smoking about 41 years ago. Her smoking use included cigarettes. She started smoking about 47 years ago. She has a 9.0 pack-year smoking history. She has never used smokeless tobacco. She reports that she does not drink alcohol and does not use drugs.Family History:Family History Problem Relation Age of Onset  Hypertension Mother   Aneurysm Mother 67      Cause of death  Colon cancer Father   Diabetes Sister   Cancer Sister       bone  Kidney disease Sister   No Known Problems Maternal Grandmother   No Known Problems Maternal Grandfather   No Known Problems Paternal Grandmother   No Known Problems Paternal Grandfather   Medication:Outpatient Encounter Medications as of 11/07/2022 Medication Sig Dispense Refill  albuterol sulfate 90 mcg/actuation HFA aerosol inhaler Inhale 2 puffs into the lungs every 6 (six) hours as needed for wheezing. 18 g 3  amLODIPine (NORVASC) 10 mg tablet Take 1 tablet (10 mg total) by mouth daily. 90 tablet 4  aspirin 81 mg EC delayed release tablet Take 1 tablet (81 mg total) by mouth daily. 90 tablet 0  azithromycin (ZITHROMAX) 250 mg tablet Take 2 tablets now, then take 1 tablet daily day 2-5. 6 tablet 0  BLOOD GLUCOSE METER (ACCU-CHEK AVIVA PLUS METER) device Test daily 1 each 0  blood sugar diagnostic (ACCU-CHEK AVIVA PLUS TEST STRP) test strips Test once daily 100 each 3  cetirizine (ZYRTEC) 10 mg chewable tablet Take 1 tablet (10 mg total) by mouth daily as needed for allergies.    cetirizine-pseudoephedrine (ZYRTEC-D) 5-120 mg per tablet Take 1 tablet by mouth 2 (two) times daily. 14 tablet 0  codeine-guaiFENesin (GUAIFENESIN AC) 10-100 mg/5 mL syrup Take 10 mLs by mouth 3 (three) times daily as needed for cough. 237 mL 0  fluticasone propionate (FLONASE) 50 mcg/actuation nasal spray Use 2 sprays in each nostril daily. 16 g 11  ibuprofen (ADVIL,MOTRIN) 800 mg tablet Take 1 tablet (  800 mg total) by mouth every 8 (eight) hours as needed (moderate to severe pain). 60 tablet 2  ipratropium (ATROVENT) 42 mcg (0.06 %) nasal spray Use 2 sprays in each nostril 3 (three) times daily. (Patient not taking: Reported on 10/20/2022) 15 mL 12  metFORMIN XR (GLUCOPHAGE-XR) 750 mg 24 hr tablet Take 2 tablets (1,500 mg total) by mouth daily with dinner. (Patient taking differently: Take 1 tablet (750 mg total) by mouth 2 (two) times daily with breakfast and dinner.) 180 tablet 3  pravastatin (PRAVACHOL) 40 mg tablet Take 1 tablet (40 mg total) by mouth daily. 90 tablet 3  telmisartan (MICARDIS) 80 mg tablet Take 1 tablet (80 mg total) by mouth daily. 90 tablet 4  traMADoL (ULTRAM) 50 mg tablet Take 1-2 tablets (50-100 mg total) by mouth every 6 (six) hours as needed. (Patient not taking: Reported on 10/20/2022)    triamcinolone (KENALOG) 0.5 % cream Apply topically 2 (two) times daily (Patient not taking: Reported on 10/20/2022) 30 g 0 No facility-administered encounter medications on file as of 11/07/2022.  Allergies:Allergies Allergen Reactions  Seasonal Allergies Cough and Nasal Congestion  Sitagliptin Dizziness  PHYSICAL EXAM: Constitutional:  The patient is well-developed and well-nourished in no acute distress.General: Normal voiceHead and Face:  Normocephalic, atraumatic without evidence of lesions. Face symmetric, no maxillary/frontal sinus tenderness upon palpationEyes:  Extraocular movements were intact. Conjunctivae are normal. Pupils are equal and round. Ears: AD: The external ear is well-developed. The external auditory canal is patent. The tympanic membrane is intact without evidence of perforation or effusion. AS: The external ear is well-developed. The external auditory canal is patent. The tympanic membrane is intact without evidence of perforation or effusionTuning Forks: Weber midline.  Rinne tuning fork test normal (AC> BC).  Finger rub test is bilaterally normal. Nose: The external nose appears normal.  Septum is midline. Turbinates are 2+. OC/Oropharynx: Good dentition. Moist oral mucosa.  The floor of mouth and oral tongue are soft. The tongue is fully mobile. Uvula is midline.  Tonsils are Pharynx/Larynx:  Pharynx normal, no lesions notedNeck: The neck is soft and supple. There is no tenderness over the thyrohyoid area. There are no thyroid masses. There are no parotid masses and no submandibular salivary gland masses. No lymphadenopathyDiagnosis:1. Chronic rhinitis    2. Rhinorrhea    3. Vasomotor rhinitis      Assessment/Plan:Rhinorrhea This has improved with use of ipratroprium bromide Will continue daily use. Will hold on RAST and imaging at this time.   The patient seemed to be in agreement and will contact me for persistent or progressive symptoms or return in follow-up.15  minute encounterElectronically Signed by Magdalene Patricia, PA

## 2022-11-10 ENCOUNTER — Ambulatory Visit: Admit: 2022-11-10 | Payer: Medicare (Managed Care) | Attending: Internal Medicine | Primary: Internal Medicine

## 2022-11-10 LAB — ALLERGEN, RESPIRATORY PANEL (BH GH LMW YH)
BKR ALLERGEN ALTERNARIA ALTERNARA (TENUIS) IGE CONC: <0.1 kU/L (ref <0.10)
BKR ALLERGEN ASPERGILLUS FUMIGATUS IGE CONC: <0.1 kU/L (ref <0.10)
BKR ALLERGEN BERMUDA GRASS IGE CONC: <0.1 kU/L (ref <0.10)
BKR ALLERGEN CAT DANDER IGE CONC: <0.1 kU/L (ref <0.10)
BKR ALLERGEN CEDAR MOUNTAIN IGE CONC: <0.1 kU/L (ref <0.10)
BKR ALLERGEN CLADOSPORIUM HERBARUM IGE CONC: <0.1 kU/L (ref <0.10)
BKR ALLERGEN COMMON (SHORT) RAGWEED IGE CONC: <0.1 kU/L (ref <0.10)
BKR ALLERGEN COTTONWOOD IGE CONC: <0.1 kU/L (ref <0.10)
BKR ALLERGEN D. FARINAE IGE CONC: <0.1 kU/L (ref <0.10)
BKR ALLERGEN D. PTERONYSSINUS IGE CONC: <0.1 kU/L (ref <0.10)
BKR ALLERGEN DOG DANDER IGE CONC: <0.1 kU/L (ref <0.10)
BKR ALLERGEN ELM IGE CONC: <0.1 kU/L (ref <0.10)
BKR ALLERGEN GERMAN COCKROACH IGE CONC: <0.1 kU/L (ref <0.10)
BKR ALLERGEN MAPLE (BOX ELDER) IGE CONC: <0.1 kU/L (ref <0.10)
BKR ALLERGEN MUGWORT IGE CONC: <0.1 kU/L (ref <0.10)
BKR ALLERGEN OAK IGE CONC: <0.1 kU/L (ref <0.10)
BKR ALLERGEN PENICILLIUM CHRYSOGENUM/NOTATUM IGE CONC: <0.1 kU/L (ref <0.10)
BKR ALLERGEN ROUGH PIGWEED IGE CONC: <0.1 kU/L (ref <0.10)
BKR ALLERGEN SHEEP SORREL IGE CONC: <0.1 kU/L (ref <0.10)
BKR ALLERGEN SILVER BIRCH IGE CONC: <0.1 kU/L (ref <0.10)
BKR ALLERGEN SYCAMORE IGE CONC: <0.1 kU/L (ref <0.10)
BKR ALLERGEN TIMOTHY GRASS IGE CONC: <0.1 kU/L (ref <0.10)
BKR ALLERGEN WALNUT TREE IGE CONC: <0.1 kU/L (ref <0.10)
BKR ALLERGEN WHITE ASH IGE CONC: <0.1 kU/L (ref <0.10)
BKR ALLERGEN WHITE MULBERRY IGE CONC: <0.1 kU/L (ref <0.10)
BKR ALLERGEN, MOUSE URINE PROTEINS IGE CONC: <0.1 kU/L (ref <0.10)
BKR IGE TOTAL: <2 kU/L (ref <=214)

## 2022-11-10 MED ORDER — METHYLPREDNISOLONE 4 MG TABLETS IN A DOSE PACK
4 | ORAL_TABLET | ORAL | 1 refills | 6.00000 days | Status: AC
Start: 2022-11-10 — End: ?

## 2022-11-21 ENCOUNTER — Encounter
Admit: 2022-11-21 | Payer: PRIVATE HEALTH INSURANCE | Attending: Vascular and Interventional Radiology | Primary: Internal Medicine

## 2022-11-27 ENCOUNTER — Encounter: Admit: 2022-11-27 | Payer: PRIVATE HEALTH INSURANCE | Primary: Internal Medicine

## 2022-12-01 ENCOUNTER — Encounter: Admit: 2022-12-01 | Payer: PRIVATE HEALTH INSURANCE | Attending: Internal Medicine | Primary: Internal Medicine

## 2022-12-01 MED ORDER — BENZONATATE 100 MG CAPSULE
100 | ORAL_CAPSULE | Freq: Three times a day (TID) | ORAL | 1 refills | 7.00000 days | Status: AC | PRN
Start: 2022-12-01 — End: 2023-02-25

## 2022-12-03 ENCOUNTER — Inpatient Hospital Stay: Admit: 2022-12-03 | Discharge: 2022-12-03 | Payer: PRIVATE HEALTH INSURANCE | Primary: Internal Medicine

## 2022-12-03 DIAGNOSIS — E042 Nontoxic multinodular goiter: Secondary | ICD-10-CM

## 2022-12-04 ENCOUNTER — Encounter
Admit: 2022-12-04 | Payer: PRIVATE HEALTH INSURANCE | Attending: Endocrinology, Diabetes & Metabolism | Primary: Internal Medicine

## 2022-12-04 DIAGNOSIS — E042 Nontoxic multinodular goiter: Secondary | ICD-10-CM

## 2022-12-04 NOTE — Other
 Hi Carena:Thyroid sono shows 2 thyroid nodulesOne is 3 cm and the other is 4 cm. The 2nd nodule has increased ins ize a bit - this one was biopsied around 11 years ago and it was OK then. My suggestion would be to consider a repeat biopsy now given the size of the nodule vs we can repeat another thyroid sono in 1 yr to see how it looks. I went ahead and ordered a repeat thyroid sono forr 11/2023 in the meantime so we can keep an eye on it Best,Dr [facility]

## 2022-12-10 ENCOUNTER — Encounter: Admit: 2022-12-10 | Payer: Medicare (Managed Care) | Attending: Internal Medicine | Primary: Internal Medicine

## 2022-12-15 ENCOUNTER — Inpatient Hospital Stay: Admit: 2022-12-15 | Payer: PRIVATE HEALTH INSURANCE | Primary: Internal Medicine

## 2022-12-20 ENCOUNTER — Inpatient Hospital Stay: Admit: 2022-12-20 | Discharge: 2022-12-20 | Payer: PRIVATE HEALTH INSURANCE | Primary: Internal Medicine

## 2022-12-20 DIAGNOSIS — E210 Primary hyperparathyroidism: Secondary | ICD-10-CM

## 2022-12-20 MED ORDER — SODIUM CHLORIDE 0.9 % LARGE VOLUME SYRINGE FOR AUTOINJECTOR
Freq: Once | INTRAVENOUS | Status: CP | PRN
Start: 2022-12-20 — End: ?
  Administered 2022-12-20: 17:00:00 via INTRAVENOUS

## 2022-12-20 MED ORDER — IOHEXOL 350 MG IODINE/ML INTRAVENOUS SOLUTION
350 | Freq: Once | INTRAVENOUS | Status: CP | PRN
Start: 2022-12-20 — End: ?
  Administered 2022-12-20: 17:00:00 350 mL via INTRAVENOUS

## 2022-12-22 ENCOUNTER — Encounter
Admit: 2022-12-22 | Payer: PRIVATE HEALTH INSURANCE | Attending: Endocrinology, Diabetes & Metabolism | Primary: Internal Medicine

## 2022-12-22 NOTE — Other
 Sent mcmHi VivianThe [place] scan of the neck shows a likely LEFT sided parathyroid enlarged (in other words - the [place] you [place] in the [place] removed one of the parathyroid glands on the RIGHT side).This means that we *might* need to consider a SECOND surgery in the neckHowever your more recent calcium levels are in reasonable rangeI would like to see what your bone density test looks like in 01/2023 before finalizing my recommendationsAlso - do you have a dentist?The [place] scan showed: also concern for a dental abscess. I would recommend seeing a dentist about this tooth abscess as this could worsen into severe infection etc. Here's the information from the report:Extensive dental and periodontal disease including a suspected 14 mm periapical abscess surrounding the right second maxillary incisor with dehiscence/contiguity with the incisive canal.Best,Dr Leonie Amacher

## 2022-12-31 ENCOUNTER — Encounter: Admit: 2022-12-31 | Payer: Medicare (Managed Care) | Attending: Internal Medicine | Primary: Internal Medicine

## 2023-01-06 ENCOUNTER — Encounter: Admit: 2023-01-06 | Payer: Medicare (Managed Care) | Attending: Internal Medicine | Primary: Internal Medicine

## 2023-01-07 ENCOUNTER — Inpatient Hospital Stay: Admit: 2023-01-07 | Discharge: 2023-01-07 | Payer: PRIVATE HEALTH INSURANCE | Primary: Internal Medicine

## 2023-01-07 DIAGNOSIS — M85852 Other specified disorders of bone density and structure, left thigh: Secondary | ICD-10-CM

## 2023-01-07 DIAGNOSIS — Z13820 Encounter for screening for osteoporosis: Secondary | ICD-10-CM

## 2023-01-13 ENCOUNTER — Encounter: Admit: 2023-01-13 | Payer: Medicare (Managed Care) | Attending: Internal Medicine | Primary: Internal Medicine

## 2023-01-21 ENCOUNTER — Encounter: Admit: 2023-01-21 | Payer: Medicare (Managed Care) | Attending: Internal Medicine | Primary: Internal Medicine

## 2023-02-18 ENCOUNTER — Encounter: Admit: 2023-02-18 | Payer: Medicare (Managed Care) | Attending: Internal Medicine | Primary: Internal Medicine

## 2023-02-23 ENCOUNTER — Encounter: Admit: 2023-02-23 | Payer: PRIVATE HEALTH INSURANCE | Attending: Internal Medicine | Primary: Internal Medicine

## 2023-02-23 DIAGNOSIS — E782 Mixed hyperlipidemia: Secondary | ICD-10-CM

## 2023-02-24 MED ORDER — PRAVASTATIN 40 MG TABLET
40 | ORAL_TABLET | Freq: Every day | ORAL | 4 refills | 90.00000 days | Status: AC
Start: 2023-02-24 — End: 2023-02-25

## 2023-02-25 ENCOUNTER — Encounter: Admit: 2023-02-25 | Payer: PRIVATE HEALTH INSURANCE | Attending: Internal Medicine | Primary: Internal Medicine

## 2023-02-25 ENCOUNTER — Ambulatory Visit: Admit: 2023-02-25 | Payer: Medicare (Managed Care) | Attending: Internal Medicine | Primary: Internal Medicine

## 2023-02-25 ENCOUNTER — Ambulatory Visit: Admit: 2023-02-25 | Payer: PRIVATE HEALTH INSURANCE | Attending: Internal Medicine | Primary: Internal Medicine

## 2023-02-25 VITALS — BP 120/60 | HR 70 | Resp 18 | Ht 64.0 in | Wt 235.0 lb

## 2023-02-25 DIAGNOSIS — E079 Disorder of thyroid, unspecified: Secondary | ICD-10-CM

## 2023-02-25 DIAGNOSIS — E010 Iodine-deficiency related diffuse (endemic) goiter: Secondary | ICD-10-CM

## 2023-02-25 DIAGNOSIS — K5730 Diverticulosis of large intestine without perforation or abscess without bleeding: Secondary | ICD-10-CM

## 2023-02-25 DIAGNOSIS — E785 Hyperlipidemia, unspecified: Secondary | ICD-10-CM

## 2023-02-25 DIAGNOSIS — I10 Essential (primary) hypertension: Secondary | ICD-10-CM

## 2023-02-25 DIAGNOSIS — R600 Localized edema: Secondary | ICD-10-CM

## 2023-02-25 DIAGNOSIS — M51379 Degeneration of lumbar or lumbosacral intervertebral disc: Secondary | ICD-10-CM

## 2023-02-25 DIAGNOSIS — Z0000 Encounter for general adult medical examination without abnormal findings: Secondary | ICD-10-CM

## 2023-02-25 DIAGNOSIS — E782 Mixed hyperlipidemia: Secondary | ICD-10-CM

## 2023-02-25 DIAGNOSIS — Z8659 Personal history of other mental and behavioral disorders: Secondary | ICD-10-CM

## 2023-02-25 DIAGNOSIS — E559 Vitamin D deficiency, unspecified: Secondary | ICD-10-CM

## 2023-02-25 DIAGNOSIS — Z87891 Personal history of nicotine dependence: Secondary | ICD-10-CM

## 2023-02-25 DIAGNOSIS — I5189 Other ill-defined heart diseases: Secondary | ICD-10-CM

## 2023-02-25 DIAGNOSIS — M545 Low back pain: Secondary | ICD-10-CM

## 2023-02-25 DIAGNOSIS — E119 Type 2 diabetes mellitus without complications: Secondary | ICD-10-CM

## 2023-02-25 DIAGNOSIS — E213 Hyperparathyroidism, unspecified: Secondary | ICD-10-CM

## 2023-02-25 DIAGNOSIS — I517 Cardiomegaly: Secondary | ICD-10-CM

## 2023-02-25 DIAGNOSIS — J9801 Acute bronchospasm: Secondary | ICD-10-CM

## 2023-02-25 DIAGNOSIS — R7302 Impaired glucose tolerance (oral): Secondary | ICD-10-CM

## 2023-02-25 DIAGNOSIS — I272 Pulmonary hypertension, unspecified: Secondary | ICD-10-CM

## 2023-02-25 MED ORDER — PRAVASTATIN 40 MG TABLET
40 | ORAL_TABLET | Freq: Every day | ORAL | 5 refills | 90.00000 days | Status: AC
Start: 2023-02-25 — End: ?

## 2023-03-05 ENCOUNTER — Encounter: Admit: 2023-03-05 | Payer: PRIVATE HEALTH INSURANCE | Attending: Internal Medicine | Primary: Internal Medicine

## 2023-03-05 MED ORDER — AMLODIPINE 10 MG TABLET
10 | ORAL_TABLET | Freq: Every day | ORAL | 5 refills | 90.00000 days | Status: AC
Start: 2023-03-05 — End: 2023-07-29

## 2023-03-05 MED ORDER — METFORMIN ER 750 MG TABLET,EXTENDED RELEASE 24 HR
750 | ORAL_TABLET | Freq: Two times a day (BID) | ORAL | 5 refills | 90.00000 days | Status: AC
Start: 2023-03-05 — End: ?

## 2023-03-10 ENCOUNTER — Ambulatory Visit
Admit: 2023-03-10 | Payer: PRIVATE HEALTH INSURANCE | Attending: Endocrinology, Diabetes & Metabolism | Primary: Internal Medicine

## 2023-03-10 ENCOUNTER — Encounter
Admit: 2023-03-10 | Payer: PRIVATE HEALTH INSURANCE | Attending: Endocrinology, Diabetes & Metabolism | Primary: Internal Medicine

## 2023-03-10 VITALS — BP 196/91 | HR 96 | Ht 64.0 in | Wt 239.0 lb

## 2023-03-10 DIAGNOSIS — E785 Hyperlipidemia, unspecified: Secondary | ICD-10-CM

## 2023-03-10 DIAGNOSIS — E559 Vitamin D deficiency, unspecified: Secondary | ICD-10-CM

## 2023-03-10 DIAGNOSIS — I5189 Other ill-defined heart diseases: Secondary | ICD-10-CM

## 2023-03-10 DIAGNOSIS — M545 Low back pain: Secondary | ICD-10-CM

## 2023-03-10 DIAGNOSIS — E119 Type 2 diabetes mellitus without complications: Secondary | ICD-10-CM

## 2023-03-10 DIAGNOSIS — E213 Hyperparathyroidism, unspecified: Secondary | ICD-10-CM

## 2023-03-10 DIAGNOSIS — Z87891 Personal history of nicotine dependence: Secondary | ICD-10-CM

## 2023-03-10 DIAGNOSIS — R7989 Other specified abnormal findings of blood chemistry: Secondary | ICD-10-CM

## 2023-03-10 DIAGNOSIS — K5730 Diverticulosis of large intestine without perforation or abscess without bleeding: Secondary | ICD-10-CM

## 2023-03-10 DIAGNOSIS — R7302 Impaired glucose tolerance (oral): Secondary | ICD-10-CM

## 2023-03-10 DIAGNOSIS — I517 Cardiomegaly: Secondary | ICD-10-CM

## 2023-03-10 DIAGNOSIS — E079 Disorder of thyroid, unspecified: Secondary | ICD-10-CM

## 2023-03-10 DIAGNOSIS — E210 Primary hyperparathyroidism: Secondary | ICD-10-CM

## 2023-03-10 DIAGNOSIS — J9801 Acute bronchospasm: Secondary | ICD-10-CM

## 2023-03-10 DIAGNOSIS — I10 Essential (primary) hypertension: Secondary | ICD-10-CM

## 2023-03-10 DIAGNOSIS — R600 Localized edema: Secondary | ICD-10-CM

## 2023-03-10 DIAGNOSIS — R899 Unspecified abnormal finding in specimens from other organs, systems and tissues: Secondary | ICD-10-CM

## 2023-03-10 DIAGNOSIS — M51379 Degeneration of lumbar or lumbosacral intervertebral disc: Secondary | ICD-10-CM

## 2023-03-10 DIAGNOSIS — Z8659 Personal history of other mental and behavioral disorders: Secondary | ICD-10-CM

## 2023-03-10 DIAGNOSIS — I272 Pulmonary hypertension, unspecified: Secondary | ICD-10-CM

## 2023-03-10 DIAGNOSIS — E042 Nontoxic multinodular goiter: Secondary | ICD-10-CM

## 2023-03-10 NOTE — Patient Instructions
 Thyroid labs soonThyroid sono [place] in October 2025

## 2023-03-10 NOTE — Progress Notes
 Follow Up [facility] CC: hypercalcemia, hx of parathyroidectomyHPI: Lori Kerr is a 73 y.o. female here for an endocrinology consultation at the [facility] requested by Dr. Beatris Ship for hypercalcemia in setting of hx of primary hyperparathyroidism s/p parathyroidectomy in 2021.Lori Kerr has a history of DM2, HTN, HLD, multinodular goiter, primary hyperparathyroidism s/p PTxShe had parathyroidectomy in [facility] [place] in 05/20/2019 by surgeon Dr Duanne Guess. Per the OP note, she had an enlarged right superior parathyroid gland, descended within tracheoesophageal groove, enlarged multinodular thyroid, had appropriate drpp in intra-op PTH, Op note indicates 50% drop in PTH intraop   She was previously on Sensipar 30 mg daily for hypercalcemia in 2020 and did not tolerate GI side effects. Has always been on telmisartan-HCTZ combo even before the 2020 diagnosis of hyperparathyroidism. Her Ca was 12.7, PTH 73 in 2021 when diagnosedHer repeat Ca level off the HCTZ was 11 in 2021. Had normal SPEP, UPEP at that timeDXA [facility] 2021 showed normal BMD at distal radius and hipReportedly had thyroid sono with multiple nodules -  3 had FNAs' that were reportedly benignLOV in 7/2024Since then, the combo telmisartan-HCTZ was stopped after discussion w/ PCP, re hypercalcemiaShe has not had hypercalcemia since then4D Kay scan suggested LEFT parathyroidadenoma and possible dental abscessShe does not report dental symptomsDXA 2024 with osteopenia at [facility] but otherwise normalWrist DXA not doneThyroid [place] in 2024 with left sided nodules 3 cm- similar to prior. Biopsied in past. She did not take any of her BP meds today Bone Health HistoryFalls or fractures: - had a toe fracture, after falling down the stairs, around age 59Kidney stones: noHeight loss:  noBack pain: has had ongoing back pain for many years, had surgery for it, and when stands long time has pressureSteroid Exposure: in past , never for than 2 weeks at a timeDietary calcium intake: Loves cheese, occ drinks milkVaries eg mac and cheese or grilled cheese ayDenies lactose intoleranceCalcium supplement: no TUMs or calcium supplements Separate Vitamin D supplement: one daily, does not know amountMVI: noExercise: was going to gym but husband had some heart problems so she stopped going to gymLast dental visit: 2024Dental procedures: dentist wants to pull several teeth, she has a tooth that is loose that needs to be fixedPrior bone medications:Name		Dates of treatment		Side effectsDoes not think soThe ROS has been reviewed and, except for where noted above, there are no reported changes in the following areas: constitutional, skin, eye, ENT, respiratory, CV, GI, GU, reproductive, MS, or neurological. Relevant History:Menstrual Hx: Age of menarche= 17Regular periods: yes but had hysterectomy Age of menopause= around 52Estrogen use: no HRTPMH:  has a past medical history of Bronchospasm, Degeneration of lumbar or lumbosacral intervertebral disc, Diabetes mellitus (HC Code), Diastolic dysfunction (12/04/2004), Dilated ventricle (12/04/2004), Disease of thyroid gland, Diverticula, colon (11/03/2004), Edema of lower extremity, History of depression, History of tobacco use, Hyperlipidemia, Hyperparathyroidism (HC Code) (05/20/2019), Hypertension, Impaired glucose tolerance, Low back pain, Pulmonary HTN (HC Code), Type 2 diabetes mellitus (HC Code), and Vitamin D deficiency.PSH:  has a past surgical history that includes Hysterectomy; Ectopic pregnancy surgery; Colonoscopy (02/04/2004); Parathyroidectomy (05/20/2019); and Colonoscopy (05/04/2017).Meds: Current Outpatient Medications:   albuterol sulfate 90 mcg/actuation HFA aerosol inhaler, Inhale 2 puffs into the lungs every 6 (six) hours as needed for wheezing., Disp: 18 g, Rfl: 3  amLODIPine (NORVASC) 10 mg tablet, Take 1 tablet (10 mg total) by mouth daily., Disp: 90 tablet, Rfl: 4  aspirin 81 mg EC delayed release tablet, Take 1  tablet (81 mg total) by mouth daily., Disp: 90 tablet, Rfl: 0  BLOOD GLUCOSE METER (ACCU-CHEK AVIVA PLUS METER) device, Test daily, Disp: 1 each, Rfl: 0  blood sugar diagnostic (ACCU-CHEK AVIVA PLUS TEST STRP) test strips, Test once daily, Disp: 100 each, Rfl: 3  cetirizine (ZYRTEC) 10 mg chewable tablet, Take 1 tablet (10 mg total) by mouth daily as needed for allergies., Disp: , Rfl:   cetirizine-pseudoephedrine (ZYRTEC-D) 5-120 mg per tablet, Take 1 tablet by mouth 2 (two) times daily., Disp: 14 tablet, Rfl: 0  fluticasone propionate (FLONASE) 50 mcg/actuation nasal spray, Use 2 sprays in each nostril daily. (Patient taking differently: Use 2 sprays in each nostril daily as needed.), Disp: 16 g, Rfl: 11  ibuprofen (ADVIL,MOTRIN) 800 mg tablet, Take 1 tablet (800 mg total) by mouth every 8 (eight) hours as needed (moderate to severe pain)., Disp: 60 tablet, Rfl: 2  metFORMIN XR (GLUCOPHAGE-XR) 750 mg 24 hr tablet, Take 1 tablet (750 mg total) by mouth 2 (two) times daily with breakfast and dinner., Disp: 180 tablet, Rfl: 4  pravastatin (PRAVACHOL) 40 mg tablet, Take 1 tablet (40 mg total) by mouth daily., Disp: 90 tablet, Rfl: 4  telmisartan (MICARDIS) 80 mg tablet, Take 1 tablet (80 mg total) by mouth daily., Disp: 90 tablet, Rfl: 4  traMADoL (ULTRAM) 50 mg tablet, Take 1-2 tablets (50-100 mg total) by mouth every 6 (six) hours as needed., Disp: , Rfl:   triamcinolone (KENALOG) 0.5 % cream, Apply topically 2 (two) times daily, Disp: 30 g, Rfl: 0Allergies: Seasonal allergies and SitagliptinFamily Hx: No family hx of primary hyperparathyroidism or thyroid issuesNo hx of hypercalcemia in famNo kidney stones Social Hx: 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 Body mass index is 41.02 kg/m?Marland KitchenWt Readings from Last 1 Encounters: 03/10/23 108.4 kg Ht Readings from Last 1 Encounters: 03/10/23 5' 4 (1.626 m) General: No acute distressLabs:Component    Latest Ref Rng 02/25/2023 Sodium    136 - 144 mmol/L 138  Potassium    3.3 - 5.3 mmol/L 4.7  Chloride    98 - 107 mmol/L 106  CO2    20 - 30 mmol/L 21  Anion Gap    7 - 17  11  Glucose    70 - 100 mg/dL 161 (H)  BUN    8 - 23 mg/dL 15  Creatinine    0.96 - 1.30 mg/dL 0.45  Calcium    8.8 - 10.2 mg/dL 40.9  BUN/Creatinine Ratio    8.0 - 23.0  18.8  Total Protein    5.9 - 8.3 g/dL 6.7  Albumin    3.6 - 5.1 g/dL 4.3  Total Bilirubin    <=1.2 mg/dL 0.4  Alkaline Phosphatase    9 - 122 U/L 68  Alanine Aminotransferase (ALT)    10 - 35 U/L 15  Aspartate Aminotransferase (AST)    10 - 35 U/L 24  Globulin    2.0 - 3.9 g/dL 2.4  A/G Ratio    1.0 - 2.2  1.8  AST/ALT  Ratio    Reference Range Not Established  1.6  eGFR (Creatinine)    >=60 mL/min/1.23m2 >60  Creatinine Delta    See Comment  -0.14  Parathyroid Hormone, Intact    15.0 - 65.0 pg/mL 67.8 (H)  Thyroid Stimulating Hormone, 3rd Gen.    See Comment ?IU/mL 0.152 (L)  Vitamin D 25-Hydroxy Total    See Comment ng/mL 37   Legend:(H) High(L) LowComponent    Latest Ref Rng 12/20/2021 03/07/2022 06/06/2022 Sodium    135 - 146 mmol/L 139  138  137  Potassium    3.5 - 5.3 mmol/L 4.1  4.0  4.3  Chloride    98 - 110 mmol/L 104  103  102  CO2    20 - 32 mmol/L 20  23  27   Anion Gap    7 - 17 15    Glucose    65 - 99 mg/dL 147 (H)  88  94  BUN    7 - 25 mg/dL 17  21  15   Creatinine    0.50 - 1.05 mg/dL 8.29  5.62  1.30  Calcium    8.6 - 10.4 mg/dL 86.5 (H)  78.4 (H)  69.6 (H)  BUN/Creatinine Ratio    6 - 22 (calc) 18.9  SEE NOTE:  SEE NOTE:  Total Protein    6.6 - 8.7 g/dL 6.4 (L)    Albumin    3.6 - 4.9 g/dL 4.0    Total Bilirubin    <=1.2 mg/dL 0.3    Alkaline Phosphatase    9 - 122 U/L 54    Alanine Aminotransferase (ALT)    10 - 35 U/L 19    Aspartate Aminotransferase (AST)    10 - 35 U/L 22    Globulin    2.3 - 3.5 g/dL 2.4    A/G Ratio    1.0 - 2.2  1.7    AST/ALT Ratio    Reference Range Not Established  1.2    eGFR (Creatinine)    >=60 mL/min/1.50m2 >60    eGFR (Creatinine)    > OR = 60 mL/min/1.17m2  69  60  Thyroid Stimulating Hormone, 3rd Gen.    See Comment ?IU/mL 0.621    Vitamin D 25-Hydroxy Total    See Comment ng/mL 49    Parathyroid Hormone, Intact    15.0 - 65.0 pg/mL 47.1     Legend:Imaging: - DXA - 02/2019 DUKESpine not scanned4D [place] 11/2024Suspected 7 mm parathyroid adenoma posterior to the superior LEFT thyroid lobe. Extensive dental and periodontal disease including a suspected 14 mm periapical abscess surrounding the right second maxillary incisor with dehiscence/contiguity with the incisive canal.[facility] 12/[facility]24FRAX 3.2% MOF/Hip 0.2%Femoral neck: BMD 0.779 (g/cm2), T-score -1.2, Z-score 0.2. Total hip: BMD 1.067 (g/cm2), T-score 0.2, Z-score 1.4.Lumbar spine: BMD 1.312 (g/cm2), T-score 1.8, Z-score 4.0. Assessment: Lori Kerr is a 73 y.o. woman with DM2, HTN, HLD, primary hyperparathyroidism s/p right superior parathyroidectomy in 2021Referred for hypercalcemia normal PTH Ca around 10s-11Was on combined telmisartan-hctz which could have been increasing the CaSo this was stopped in Summer 2024No further hypercalcemia since then4D [place] 2024 with left parathyroid adenomaPTH just slightly high in 60's with Ca at ULN 10.2She prefers also to observe thisShe also has multinodular goiter s/p prior benign FNAsThyroid [place] 11/2022 with left sided 3 cm nodules. DXA 01/2023 with normal BMD at LS and TH, mild osteopenia at FN. Wrist not doneRecommendations:#primary hyperpara s/p PTx- DXA including wrist YNHH in 01/2025- repeat  PTH, Ca yearly- if Calcium > 11 let me know- NO CALCIUM PILLS OR TUMS. Take 3 servings of calcium containing food from your diet- vitamin D continue at current dose- please follow up with dentist re findings of dental abscess on 4D [place] (pt is asymptomatic) #multinodular goiter- recent TSH on low end. Discussed that sometimes thyroid nodules become autonomous and make excess thyroid hormone. Check TSH with reflex to Free T4- thyroid US 11/2023 [facility] ordered- Return for follow up in 1 yr 03/2024 Krystale Rinkenberger K. Katricia Prehn, MDEndocrinology

## 2023-03-12 ENCOUNTER — Encounter: Admit: 2023-03-12 | Payer: PRIVATE HEALTH INSURANCE | Attending: Ophthalmology | Primary: Internal Medicine

## 2023-03-16 ENCOUNTER — Encounter: Admit: 2023-03-16 | Payer: PRIVATE HEALTH INSURANCE | Attending: Internal Medicine | Primary: Internal Medicine

## 2023-03-16 MED ORDER — METHYLPREDNISOLONE 4 MG TABLETS IN A DOSE PACK
4 | ORAL_TABLET | ORAL | 1 refills | 6.00000 days | Status: AC
Start: 2023-03-16 — End: ?

## 2023-04-30 ENCOUNTER — Ambulatory Visit: Admit: 2023-04-30 | Payer: Medicare (Managed Care) | Attending: Internal Medicine | Primary: Internal Medicine

## 2023-04-30 ENCOUNTER — Encounter: Admit: 2023-04-30 | Payer: PRIVATE HEALTH INSURANCE | Attending: Internal Medicine | Primary: Internal Medicine

## 2023-04-30 VITALS — BP 165/64 | HR 68 | Temp 97.50000°F

## 2023-04-30 DIAGNOSIS — E213 Hyperparathyroidism, unspecified: Secondary | ICD-10-CM

## 2023-04-30 DIAGNOSIS — I5189 Other ill-defined heart diseases: Secondary | ICD-10-CM

## 2023-04-30 DIAGNOSIS — M51379 Degeneration of lumbar or lumbosacral intervertebral disc: Secondary | ICD-10-CM

## 2023-04-30 DIAGNOSIS — R6889 Other general symptoms and signs: Secondary | ICD-10-CM

## 2023-04-30 DIAGNOSIS — I10 Essential (primary) hypertension: Secondary | ICD-10-CM

## 2023-04-30 DIAGNOSIS — J9801 Acute bronchospasm: Secondary | ICD-10-CM

## 2023-04-30 DIAGNOSIS — R059 Cough, unspecified type: Secondary | ICD-10-CM

## 2023-04-30 DIAGNOSIS — I499 Cardiac arrhythmia, unspecified: Secondary | ICD-10-CM

## 2023-04-30 DIAGNOSIS — Z8659 Personal history of other mental and behavioral disorders: Secondary | ICD-10-CM

## 2023-04-30 DIAGNOSIS — I517 Cardiomegaly: Secondary | ICD-10-CM

## 2023-04-30 DIAGNOSIS — E785 Hyperlipidemia, unspecified: Secondary | ICD-10-CM

## 2023-04-30 DIAGNOSIS — E559 Vitamin D deficiency, unspecified: Secondary | ICD-10-CM

## 2023-04-30 DIAGNOSIS — Z87891 Personal history of nicotine dependence: Secondary | ICD-10-CM

## 2023-04-30 DIAGNOSIS — E119 Type 2 diabetes mellitus without complications: Secondary | ICD-10-CM

## 2023-04-30 DIAGNOSIS — I272 Pulmonary hypertension, unspecified: Secondary | ICD-10-CM

## 2023-04-30 DIAGNOSIS — K5730 Diverticulosis of large intestine without perforation or abscess without bleeding: Secondary | ICD-10-CM

## 2023-04-30 DIAGNOSIS — R600 Localized edema: Secondary | ICD-10-CM

## 2023-04-30 DIAGNOSIS — R7302 Impaired glucose tolerance (oral): Secondary | ICD-10-CM

## 2023-04-30 DIAGNOSIS — J069 Acute upper respiratory infection, unspecified: Secondary | ICD-10-CM

## 2023-04-30 DIAGNOSIS — E079 Disorder of thyroid, unspecified: Secondary | ICD-10-CM

## 2023-04-30 DIAGNOSIS — M545 Low back pain: Secondary | ICD-10-CM

## 2023-04-30 MED ORDER — BENZONATATE 100 MG CAPSULE
100 | ORAL_CAPSULE | Freq: Three times a day (TID) | ORAL | 1 refills | 7.00000 days | Status: AC | PRN
Start: 2023-04-30 — End: ?

## 2023-05-26 ENCOUNTER — Ambulatory Visit: Admit: 2023-05-26 | Payer: Medicare (Managed Care) | Attending: Internal Medicine | Primary: Internal Medicine

## 2023-05-26 ENCOUNTER — Encounter: Admit: 2023-05-26 | Payer: PRIVATE HEALTH INSURANCE | Attending: Internal Medicine | Primary: Internal Medicine

## 2023-05-26 ENCOUNTER — Encounter: Admit: 2023-05-26 | Payer: Medicare (Managed Care) | Attending: Internal Medicine | Primary: Internal Medicine

## 2023-05-26 VITALS — BP 108/60 | HR 60 | Ht 64.0 in | Wt 239.0 lb

## 2023-05-26 DIAGNOSIS — I10 Essential (primary) hypertension: Secondary | ICD-10-CM

## 2023-05-26 DIAGNOSIS — E782 Mixed hyperlipidemia: Secondary | ICD-10-CM

## 2023-05-26 DIAGNOSIS — Z8659 Personal history of other mental and behavioral disorders: Secondary | ICD-10-CM

## 2023-05-26 DIAGNOSIS — E119 Type 2 diabetes mellitus without complications: Secondary | ICD-10-CM

## 2023-05-26 DIAGNOSIS — I272 Pulmonary hypertension, unspecified: Secondary | ICD-10-CM

## 2023-05-26 DIAGNOSIS — M51379 Degeneration of lumbar or lumbosacral intervertebral disc: Secondary | ICD-10-CM

## 2023-05-26 DIAGNOSIS — E785 Hyperlipidemia, unspecified: Secondary | ICD-10-CM

## 2023-05-26 DIAGNOSIS — E559 Vitamin D deficiency, unspecified: Secondary | ICD-10-CM

## 2023-05-26 DIAGNOSIS — K5730 Diverticulosis of large intestine without perforation or abscess without bleeding: Secondary | ICD-10-CM

## 2023-05-26 DIAGNOSIS — M65331 Trigger finger, right middle finger: Secondary | ICD-10-CM

## 2023-05-26 DIAGNOSIS — R600 Localized edema: Secondary | ICD-10-CM

## 2023-05-26 DIAGNOSIS — E079 Disorder of thyroid, unspecified: Secondary | ICD-10-CM

## 2023-05-26 DIAGNOSIS — Z87891 Personal history of nicotine dependence: Secondary | ICD-10-CM

## 2023-05-26 DIAGNOSIS — M545 Chronic bilateral low back pain, unspecified whether sciatica present: Secondary | ICD-10-CM

## 2023-05-26 DIAGNOSIS — I517 Cardiomegaly: Secondary | ICD-10-CM

## 2023-05-26 DIAGNOSIS — E213 Hyperparathyroidism, unspecified: Secondary | ICD-10-CM

## 2023-05-26 DIAGNOSIS — I5189 Other ill-defined heart diseases: Secondary | ICD-10-CM

## 2023-05-26 DIAGNOSIS — R7302 Impaired glucose tolerance (oral): Secondary | ICD-10-CM

## 2023-05-26 DIAGNOSIS — J9801 Acute bronchospasm: Secondary | ICD-10-CM

## 2023-05-26 DIAGNOSIS — H6990 Unspecified Eustachian tube disorder, unspecified ear: Secondary | ICD-10-CM

## 2023-06-02 ENCOUNTER — Encounter: Admit: 2023-06-02 | Payer: PRIVATE HEALTH INSURANCE | Attending: Internal Medicine | Primary: Internal Medicine

## 2023-06-24 ENCOUNTER — Ambulatory Visit: Admit: 2023-06-24 | Payer: PRIVATE HEALTH INSURANCE | Attending: Ophthalmology | Primary: Internal Medicine

## 2023-06-24 ENCOUNTER — Encounter: Admit: 2023-06-24 | Payer: Medicare (Managed Care) | Primary: Internal Medicine

## 2023-06-24 DIAGNOSIS — H1013 Acute atopic conjunctivitis, bilateral: Secondary | ICD-10-CM

## 2023-06-24 DIAGNOSIS — H35033 Hypertensive retinopathy, bilateral: Secondary | ICD-10-CM

## 2023-06-24 DIAGNOSIS — H3509 Other intraretinal microvascular abnormalities: Secondary | ICD-10-CM

## 2023-06-24 DIAGNOSIS — H269 Unspecified cataract: Secondary | ICD-10-CM

## 2023-06-24 DIAGNOSIS — H43812 Vitreous degeneration, left eye: Secondary | ICD-10-CM

## 2023-06-24 MED ORDER — OLOPATADINE 0.1 % EYE DROPS
0.1 | Freq: Two times a day (BID) | OPHTHALMIC | 13 refills | 25.00000 days | Status: AC
Start: 2023-06-24 — End: ?

## 2023-06-24 NOTE — Patient Instructions
 Retina follow-up exam in 12 months , or sooner if any change in vision or floaters or flashes, or vision distortion.Please schedule on a Monday or Wednesday afternoonDiscussed stable retinal exam  with patient, and did Optos photos and HS macula OCT Both Eyes .Recommend Patanol eye drops twice a day Both Eyes for allergy ( ordered today).

## 2023-07-29 MED ORDER — AMLODIPINE 10 MG TABLET
10 | ORAL_TABLET | Freq: Every day | ORAL | 5 refills | 90.00000 days | Status: AC
Start: 2023-07-29 — End: ?

## 2023-07-30 ENCOUNTER — Encounter: Admit: 2023-07-30 | Payer: PRIVATE HEALTH INSURANCE | Primary: Internal Medicine

## 2023-07-30 DIAGNOSIS — M65331 Trigger finger, right middle finger: Principal | ICD-10-CM

## 2023-08-14 ENCOUNTER — Ambulatory Visit: Admit: 2023-08-14 | Payer: PRIVATE HEALTH INSURANCE | Primary: Internal Medicine

## 2023-08-14 DIAGNOSIS — M65331 Trigger finger, right middle finger: Principal | ICD-10-CM

## 2023-08-14 NOTE — Progress Notes
 [facility]OR[facility]OPAED[facility] & REHABILITATIONHand & Upper Extremity SurgeryPhone: [phone] Fax: [phone] 11, 2025					Patient name: Lori Kerr MRN:	 [id number] Date of birth:	 [birth year]Provider:	Shanon Louder, [facility] complaint: Right hand middle finger trigger fingerHistory of present illness:Lori Kerr is a right HD female who presents today complaining of right middle finger trigger finger. Has been previously diagnosed with trigger finger by outside orthopedic surgeon, who performed steroid injection 06/02/23. She reports only mild temporarily relief with injection. She was referred here due to her previous orthopedic surgeon retiring for consideration of surgery. She reports daily pain to the finger, and digit locking worse in morning. She is a type II diabetic. She denies hx of trigger finger to any other digits.  Allergies: Seasonal allergies and SitagliptinMedications: She has a current medication list which includes the following prescription(s): albuterol  sulfate, amlodipine , aspirin , blood glucose meter, blood sugar diagnostic, cetirizine , cetirizine -pseudoephedrine , fluticasone  propionate, ibuprofen, metformin  xr, olopatadine , pravastatin , telmisartan , tramadol , and triamcinolone.Past medical history: She  has a past medical history of Bronchospasm, Degeneration of lumbar or lumbosacral intervertebral disc, Diabetes mellitus (HC Code), Diastolic dysfunction (12/04/2004), Dilated ventricle (12/04/2004), Disease of thyroid gland, Diverticula, colon (11/03/2004), Edema of lower extremity, History of depression, History of tobacco use, Hyperlipidemia, Hyperparathyroidism (HC Code) (HC CODE) (05/20/2019), Hypertension, Impaired glucose tolerance, Low back pain, Pulmonary HTN (HC Code), Type 2 diabetes mellitus, and Vitamin D deficiency.Past surgical history: She  has a past surgical history that includes Hysterectomy; Ectopic pregnancy surgery; Colonoscopy (02/04/2004); Parathyroidectomy (05/20/2019); and Colonoscopy (05/04/2017).Family history: Her family history includes Aneurysm (age of onset: 74) in her mother; Cancer in her sister; Colon cancer in her father; Diabetes in her sister; Hypertension in her mother; Kidney disease in her sister; No Known Problems in her maternal grandfather, maternal grandmother, paternal grandfather, and paternal grandmother.Social history: Social History Occupational History  Occupation: Retired   Associate Professor: [employer] Tobacco Use  Smoking status: Former   Current packs/day: 0.00   Average packs/day: 1.5 packs/day for 6.0 years (9.0 ttl pk-yrs)   Types: Cigarettes   Start date: 02/04/1975   Quit date: 02/03/1981   Years since quitting: 42.5  Smokeless tobacco: Never Vaping Use  Vaping status: Never Used Substance and Sexual Activity  Alcohol use: No  Drug use: No  Sexual activity: Yes   Partners: Male I have reviewed the review of systems, and it is listed in the clinical support section of the record.   Exam: Lori Kerr is normally developed, well nourished, alert and oriented X3 and in no acute distress. She demonstrates normal mood and affect.Both upper extremities are assessed for skin temperature, peripheral pulses, edema, swelling, varicosities, and lymphadenopathy. Skin is examined for turgor, color, moisture, evidence of echymosis, cyanosis, clubbing, and pigmented lesions. Upper extremities are also inspected for clinical alignment, muscle tone, evidence of assymmetric muscular atrophy, tenderness, masses, joint crepitus, or effusions, range of motion of major joints and any evidence of ligamentous instability or joint subluxation.  Coordination, sensation, motor and reflex function is assessed.  All findings are within normal parameters except as outlined below.  Pertinent findings related to her chief complaint are also described below:Lori Kerr's exam without deformity to right hand. She does have tenderness to palpation of A1 pulley of the right middle finger. She has palpable clicking and reproducible digit locking.Imaging: None indicated todayImpression and plan: Lori Kerr is a 73 y.o. female who presents today with history and physical consistent with trigger finger of right middle finger. The nature and course of this diagnosis were discussed as well as various  nonsurgical and surgical treatment options.- Patient had 1 steroid injection to this digit only 2 months ago, with only brief improvement. It is too soon to perform another injection. Discussed with her options including follow up next month for a second injection vs trigger finger release surgery. She would be interested in proceeding with trigger finger release surgery. Discussed details of surgery with her, and message sent to scheduling department to set patient up to see one of our hand surgeons for further surgical discussion. Electronically signed by: Shanon Louder, PA-CHand and Upper Extremity ServiceYale Department of Orthopaedics and RehabilitationThis report has been generated using dictation software. Although every attempt has been made by the clinician to the proofread this document, occasional misspellings and typographical errors may still be present.

## 2023-08-25 ENCOUNTER — Ambulatory Visit: Admit: 2023-08-25 | Payer: Medicare (Managed Care) | Attending: Internal Medicine | Primary: Internal Medicine

## 2023-08-25 ENCOUNTER — Encounter: Admit: 2023-08-25 | Payer: PRIVATE HEALTH INSURANCE | Attending: Internal Medicine | Primary: Internal Medicine

## 2023-08-25 DIAGNOSIS — E782 Mixed hyperlipidemia: Principal | ICD-10-CM

## 2023-08-25 DIAGNOSIS — I10 Essential (primary) hypertension: Secondary | ICD-10-CM

## 2023-08-25 DIAGNOSIS — E119 Type 2 diabetes mellitus without complications: Secondary | ICD-10-CM

## 2023-08-25 MED ORDER — NEOMYCIN-POLYMYXIN-HYDROCORT 3.5 MG/ML-10,000 UNIT/ML-1 % EAR SOLUTION
3.5-10000-1 | Freq: Three times a day (TID) | OTIC | 1 refills | 11.00000 days | Status: AC
Start: 2023-08-25 — End: ?

## 2023-08-26 ENCOUNTER — Ambulatory Visit: Admit: 2023-08-26 | Payer: PRIVATE HEALTH INSURANCE | Attending: Orthopedic Surgery | Primary: Internal Medicine

## 2023-08-26 DIAGNOSIS — M65331 Trigger finger, right middle finger: Principal | ICD-10-CM

## 2023-08-26 NOTE — Progress Notes
 [facility]	 [facility], Division of Hand and Upper Extremity SurgeryCC:  Right middle finger trigger, interested in surgery.HPI:   Lori Kerr is a 73 year old female with diabetes who presents with a right middle finger trigger finger. She was referred by Dr. Ozell for evaluation of her trigger finger.She experiences a 'clicking' sensation in her right middle finger, which has persisted despite a prior injection. The injection did not alleviate the issue.She has diabetes. She is concerned about the impact of steroid injections on her blood sugar levels.    EFY:Ejdu Medical History[1]PFH: Family History Problem Relation Age of Onset  Hypertension Mother   Aneurysm Mother 8      Cause of death  Colon cancer Father   Diabetes Sister   Cancer Sister       bone  Kidney disease Sister   No Known Problems Maternal Grandmother   No Known Problems Maternal Grandfather   No Known Problems Paternal Grandmother   No Known Problems Paternal Grandfather  EDY:Ejdu Surgical History[2]Meds: Prior to Admission medications  Medication Sig Start Date End Date Taking? Authorizing Provider albuterol  sulfate 90 mcg/actuation HFA aerosol inhaler Inhale 2 puffs into the lungs every 6 (six) hours as needed for wheezing. 03/07/22 08/25/23  Germaine Eva Nevin, MD amLODIPine  (NORVASC ) 10 mg tablet Take 1 tablet (10 mg total) by mouth daily. 07/29/23   Delta Dover, MD aspirin  81 mg EC delayed release tablet Take 1 tablet (81 mg total) by mouth daily. 03/07/22 08/25/23  Germaine Eva Nevin, MD BLOOD GLUCOSE METER (ACCU-CHEK AVIVA PLUS METER) device Test daily 09/03/18   Delta Dover, MD blood sugar diagnostic (ACCU-CHEK AVIVA PLUS TEST STRP) test strips Test once daily 09/03/18   Delta Dover, MD cetirizine  (ZYRTEC ) 10 mg chewable tablet Take 1 tablet (10 mg total) by mouth daily as needed for allergies.    Provider, Historical cetirizine -pseudoephedrine  (ZYRTEC -D) 5-120 mg per tablet Take 1 tablet by mouth 2 (two) times daily.Patient not taking: Reported on 05/26/2023 10/20/22   Delta Dover, MD fluticasone  propionate (FLONASE ) 50 mcg/actuation nasal spray Use 2 sprays in each nostril daily. 09/10/22   Theophilus Lenis, MD ibuprofen (ADVIL,MOTRIN) 800 mg tablet Take 1 tablet (800 mg total) by mouth every 8 (eight) hours as needed (moderate to severe pain). 06/11/18   Delta Dover, MD metFORMIN  XR (GLUCOPHAGE -XR) 750 mg 24 hr tablet Take 1 tablet (750 mg total) by mouth 2 (two) times daily with breakfast and dinner. 03/05/23   Delta Dover, MD neomycin-polymyxin-hydrocortisone (CORTISPORIN) otic solution Place 3 drops into both ears 3 (three) times daily for 10 days. 08/25/23 09/04/23  Delta Dover, MD olopatadine  (PATANOL) 0.1 % ophthalmic solution Place 1 drop into both eyes 2 (two) times daily. 06/24/23   Brent Rollo Shuck, MD pravastatin  (PRAVACHOL ) 40 mg tablet Take 1 tablet (40 mg total) by mouth daily. 02/25/23   Delta Dover, MD telmisartan  (MICARDIS ) 80 mg tablet Take 1 tablet (80 mg total) by mouth daily. 09/10/22   Theophilus Lenis, MD traMADoL  (ULTRAM ) 50 mg tablet Take 1-2 tablets (50-100 mg total) by mouth every 6 (six) hours as needed. 08/16/21   Provider, Historical triamcinolone (KENALOG) 0.5 % cream Apply topically 2 (two) times daily 07/14/17   Delta Dover, MD DY:Dnrpjo History[3]ALLERGIES: Seasonal allergies and SitagliptinROS: Review of systems reviewed in support section of the chart, see EPIC charts.EZ:Rnwdupulupnwjo: There were no vitals filed for this visit. Awake, alert, NADRespiratory: lungs clear to auscultation, normal respiratory effortCardiovascular: Regular heartrate, no peripheral edemaPsych: Appropriate affect.  Awake and alert to situation  MUSCULOSKELETAL: Trigger finger in right middle finger. Results        Right middle finger trigger fingerChronic condition with failed steroid injection, likely worsened by diabetes, affecting quality of life.- Schedule trigger release surgery for August 29th.- Advise tight blood sugar control to minimize infection risk.- Obtain signed consent for surgery.   Electronically Signed by Alfonso Gull, MD, July 23, 2025I provided a concise overview of the ambient note generation solution. Lori Kerr or their legally authorized representative verbally consented to a temporary audio recording of their visit to assist with completing the visit documentation using an AI-powered solution. This note was reviewed for accuracy by Alfonso Gull who performed the clinical service. [1] Past Medical History:Diagnosis Date  Bronchospasm   Degeneration of lumbar or lumbosacral intervertebral disc   Diabetes mellitus (HC Code)    Diastolic dysfunction 12/04/2004  Dilated ventricle 12/04/2004  R, mildly on echo  Disease of thyroid gland   Diverticula, colon 11/03/2004  scattered L-sided   Edema of lower extremity   History of depression   History of tobacco use   Hyperlipidemia   Hyperparathyroidism (HC Code) (HC CODE) 05/20/2019  s/p parathyroidectomy  Hypertension   Impaired glucose tolerance   Low back pain   Pulmonary HTN (HC Code)    Type 2 diabetes mellitus    Vitamin D deficiency  [2] Past Surgical History:Procedure Laterality Date  COLONOSCOPY  02/04/2004  normal exam except for a few left sided diverticuli  COLONOSCOPY  05/04/2017  ECTOPIC PREGNANCY SURGERY    HYSTERECTOMY    partial, ovaries intact  PARATHYROIDECTOMY  05/20/2019 [3] Social HistoryTobacco Use  Smoking status: Former   Current packs/day: 0.00   Average packs/day: 1.5 packs/day for 6.0 years (9.0 ttl pk-yrs)   Types: Cigarettes   Start date: 02/04/1975   Quit date: 02/03/1981   Years since quitting: 42.5  Smokeless tobacco: Never Vaping Use  Vaping status: Never Used Substance Use Topics  Alcohol use: No  Drug use: No

## 2023-09-11 ENCOUNTER — Telehealth: Admit: 2023-09-11 | Payer: PRIVATE HEALTH INSURANCE | Attending: Orthopedic Surgery | Primary: Internal Medicine

## 2023-09-11 ENCOUNTER — Encounter: Admit: 2023-09-11 | Payer: PRIVATE HEALTH INSURANCE | Primary: Internal Medicine

## 2023-09-11 NOTE — Telephone Encounter
 Lvm on pts cell to return my call to discuss upcoming surgery with Dr. Estevan Oaks back #: [phone]

## 2023-09-11 NOTE — Telephone Encounter
Patient m

## 2023-10-01 ENCOUNTER — Encounter: Admit: 2023-10-01 | Payer: PRIVATE HEALTH INSURANCE | Attending: Anesthesiology | Primary: Internal Medicine

## 2023-10-02 ENCOUNTER — Encounter: Admit: 2023-10-02 | Payer: PRIVATE HEALTH INSURANCE | Attending: Anesthesiology | Primary: Internal Medicine

## 2023-10-02 ENCOUNTER — Encounter: Admit: 2023-10-02 | Payer: PRIVATE HEALTH INSURANCE | Attending: Orthopedic Surgery | Primary: Internal Medicine

## 2023-10-02 ENCOUNTER — Inpatient Hospital Stay
Admit: 2023-10-02 | Discharge: 2023-10-02 | Payer: PRIVATE HEALTH INSURANCE | Attending: Orthopedic Surgery | Primary: Internal Medicine

## 2023-10-02 DIAGNOSIS — E119 Type 2 diabetes mellitus without complications: Secondary | ICD-10-CM

## 2023-10-02 DIAGNOSIS — M51379 Degeneration of lumbar or lumbosacral intervertebral disc: Secondary | ICD-10-CM

## 2023-10-02 DIAGNOSIS — I10 Essential (primary) hypertension: Principal | ICD-10-CM

## 2023-10-02 DIAGNOSIS — K5730 Diverticulosis of large intestine without perforation or abscess without bleeding: Secondary | ICD-10-CM

## 2023-10-02 DIAGNOSIS — I5189 Other ill-defined heart diseases: Secondary | ICD-10-CM

## 2023-10-02 DIAGNOSIS — E785 Hyperlipidemia, unspecified: Secondary | ICD-10-CM

## 2023-10-02 DIAGNOSIS — Z8659 Personal history of other mental and behavioral disorders: Secondary | ICD-10-CM

## 2023-10-02 DIAGNOSIS — J9801 Acute bronchospasm: Secondary | ICD-10-CM

## 2023-10-02 DIAGNOSIS — E079 Disorder of thyroid, unspecified: Secondary | ICD-10-CM

## 2023-10-02 DIAGNOSIS — E559 Vitamin D deficiency, unspecified: Secondary | ICD-10-CM

## 2023-10-02 DIAGNOSIS — Z87891 Personal history of nicotine dependence: Secondary | ICD-10-CM

## 2023-10-02 DIAGNOSIS — R7302 Impaired glucose tolerance (oral): Secondary | ICD-10-CM

## 2023-10-02 DIAGNOSIS — E213 Hyperparathyroidism, unspecified: Secondary | ICD-10-CM

## 2023-10-02 DIAGNOSIS — M545 Low back pain: Secondary | ICD-10-CM

## 2023-10-02 DIAGNOSIS — R600 Localized edema: Secondary | ICD-10-CM

## 2023-10-02 DIAGNOSIS — I517 Cardiomegaly: Secondary | ICD-10-CM

## 2023-10-02 DIAGNOSIS — I272 Pulmonary hypertension, unspecified: Secondary | ICD-10-CM

## 2023-10-02 MED ORDER — SODIUM CHLORIDE 0.9 % IRRIGATION SOLUTION
0.9 | Status: CP | PRN
Start: 2023-10-02 — End: ?
  Administered 2023-10-02: 16:00:00 0.9 % irrigation

## 2023-10-02 NOTE — Discharge Instructions
 [facility]	 [facility], Division of Hand and Upper Extremity Surgery Dr. Sherrill Raring Postoperative Discharge InstructionsCall with questions: [phone] or send a message through MyChart. Dr. Heriberto Antigua may not receive MyChart messages over the weekends or during the evening, so please call if your concern is urgent. Carpal Tunnel / Trigger finger / Suzette Battiest / Minor soft tissue procedure:Pain management: You should anticipate a degree of soreness and discomfort at your local anaesthesia wears off, which may take 8-10 hours. Please take tylenol or ibuprofen as needed. If you feel you require stronger pain medication, you may call the office or send a MyChart message postoperatively. We also recommend ice and elevation to help reduce pain and swelling.Dressing care: It is ok to take your dressing off in 5-7 days and leave open to air as long as there is no drainage or discharge. You may wash your hands normally, but do not soak your hand, do not use lotions or antibiotics, and pat dry after washing. Dr. Heriberto Antigua often uses nylon sutures and your incision may appear bunched up. This is because as scars heal, they contract and this will allow your incision to heal flat.Antibiotics: If prescribed antibiotics, take these as prescribed. Please call the office if you develop rashes, GI upset, or other complications related to your antibiotic.Swelling management: Please keep your wrist elevated above elbow, elbow above heart as much as possible. Apply ice packs frequently to your surgical site but take care not to allow your dressing to become wet. Activity: Please avoid strenuous activity for the next week to two weeks. Work on gentle range of motion of your elbow, shoulder, and fingers if they are not immobilized in a splint to prevent stiffness. Follow up: Please call the office [phone] to arrange your follow-up if you do not have one scheduled. Typically your sutures will be removed in 10-14 days. You may follow up with Dr. Heriberto Antigua or with Marquette Saa or Hollie Salk, our excellent APRN staff who work closely with her.

## 2023-10-06 MED ORDER — HYDROCHLOROTHIAZIDE 25 MG TABLET
25 | ORAL_TABLET | Freq: Every day | ORAL | 5 refills | 90.00000 days | Status: AC
Start: 2023-10-06 — End: ?

## 2023-10-06 NOTE — Other
 -[place] [facility] - DO NOT COPY WITHOUT APPROPRIATE AUTHORIZATION Name: Lori E SorrellsService: [facility] PERI EUnit No: [id number] CSN: [id number] Service Area: [place] of Birth: 26-Mar-1952Date of Adm: 10/06/23 Dictated: 9:24 AM Dictated by: Alfonso Gull, M.D.  DATE OF PROCEDURE/SURGERY: 10/02/23 OPERATION:R middle trigger release. Rolanda Gull, M.D.  ASSISTANT:Peter JOo ANESTHESIA:localPREOP DIAGNOSIS:R middle trigger POSTOP DIAGNOSIS:R middl trigger ESTIMATED BLOOD LOSS:N/A SPECIMENS REMOVED:None. COMPLICATIONS:  None. IMPLANTS:  None. INDICATIONS:  The patient presented with R middle trigger finger, resistant to injections.  Due to persistent symptoms, the patient was booked and consented for trigger release. PROCEDURE:  The patient was met in the preoperative holding area.  Operative site and consent were confirmed.  All questions answered.  The extremity was marked and verified.   The patient was brought back to the operating room, placed supine on the operating table.   After a time-out was performed, 5 cc of 0.25% Marcaine and 1% lidocaine plain were introduced around the area of the planned incision. The hand was prepped and draped in usual sterile fashion.  A surgical pause was performed and a longitudinal incision was made over the A1 pulley.  A soft tissue dissection was used to expose the pulley with care taken to bluntly dissect to avoid injury to the digital nerve.  The pulley was opened sharply proximally and distally.  After complete release was performed, the patient was able to make a full fist and fully straighten all fingers.  Once this was performed without any residual clicking or catching the wound was irrigated and closed with 5-0 nylon.  The patient was then sterilely dressed and placed in a soft dressing.  The patient was then brought to PACU in stable condition.  All sponge, needle, and instrument counts were correct at the end of the case.  I was present for the entire procedure.   Postoperatively, the patient can move fingers normally. Instructiosn given to avoid heavy lifting, pushing or pulling for 3 weeks and I will see the patient back in the office for suture removal in 10-14 days.

## 2023-10-15 ENCOUNTER — Encounter: Admit: 2023-10-15 | Payer: PRIVATE HEALTH INSURANCE | Primary: Internal Medicine

## 2023-10-15 DIAGNOSIS — Z9889 Other specified postprocedural states: Principal | ICD-10-CM

## 2023-10-15 NOTE — Progress Notes
 Review of Systems Musculoskeletal:       Post-op: 2wk po R middle trigger finger release 8/29/25Pain: discomfort when closing hand

## 2023-10-15 NOTE — Progress Notes
  MedicineORTHOPAEDICS & REHABILITATIONHand & Upper Extremity SurgeryPhone: [phone] Fax: [phone] 11, 2025					Patient name: Lori Kerr MRN:	 [id number] Date of birth:	 Dec 08, 1952Provider:	Shanon Louder, PAPOST OPERATIVE VISITDate of Surgery: 8/29/25Surgeon: Alfonso Gull, MDProcedure:  Trigger release right middle fingerMs. Kerr is now 2 weeks s/p above procedure.Since her surgery she has some mild pain around incision site, worse when making a fist, but otherwise states she has been doing well.  Denies any significant swelling or finger stiffness.  Denies any concerns with erythema or drainage from the wound.  Denies any recurrent clicking or locking of the finger.Exam: Lori Kerr dressing was removed post operatively as instructed.  Her sutures were removed today without incident.  The incision appears approximated, clean, and dry, without signs of infection. ROM of right middle finger intact.  Patient has full extension of the digit.  Very mild stiffness with flexionSubjective sensation intact to light touch to distal right middle fingerImaging:  none indicated at this visit.Plan and Follow-up: Lori Kerr is doing well approximately 2 weeks s/p right middle finger trigger release. She is following the expected course of healing without signs of infection.  Counseled on wound care.  Return to clinic as needed for any new or worsening symptoms.Electronically signed by: Shanon Louder, PA-C Hand and Upper Extremity ServiceYale Department of Orthopaedics and RehabilitationThis report has been generated using dictation software. Although every attempt has been made by the clinician to the proofread this document, occasional misspellings and typographical errors may still be present.

## 2023-10-21 MED ORDER — METFORMIN ER 500 MG TABLET,EXTENDED RELEASE 24 HR
500 | ORAL_TABLET | ORAL | 5 refills | 90.00000 days | Status: AC
Start: 2023-10-21 — End: ?

## 2023-10-31 ENCOUNTER — Encounter: Admit: 2023-10-31 | Payer: PRIVATE HEALTH INSURANCE | Attending: Ophthalmology | Primary: Internal Medicine

## 2023-10-31 DIAGNOSIS — J9801 Acute bronchospasm: Secondary | ICD-10-CM

## 2023-10-31 DIAGNOSIS — E559 Vitamin D deficiency, unspecified: Secondary | ICD-10-CM

## 2023-10-31 DIAGNOSIS — I272 Pulmonary hypertension, unspecified: Secondary | ICD-10-CM

## 2023-10-31 DIAGNOSIS — E785 Hyperlipidemia, unspecified: Secondary | ICD-10-CM

## 2023-10-31 DIAGNOSIS — Z8659 Personal history of other mental and behavioral disorders: Secondary | ICD-10-CM

## 2023-10-31 DIAGNOSIS — R600 Localized edema: Secondary | ICD-10-CM

## 2023-10-31 DIAGNOSIS — I10 Essential (primary) hypertension: Principal | ICD-10-CM

## 2023-10-31 DIAGNOSIS — E213 Hyperparathyroidism, unspecified: Secondary | ICD-10-CM

## 2023-10-31 DIAGNOSIS — I5189 Other ill-defined heart diseases: Secondary | ICD-10-CM

## 2023-10-31 DIAGNOSIS — I517 Cardiomegaly: Secondary | ICD-10-CM

## 2023-10-31 DIAGNOSIS — E119 Type 2 diabetes mellitus without complications: Secondary | ICD-10-CM

## 2023-10-31 DIAGNOSIS — M545 Low back pain: Secondary | ICD-10-CM

## 2023-10-31 DIAGNOSIS — R7302 Impaired glucose tolerance (oral): Secondary | ICD-10-CM

## 2023-10-31 DIAGNOSIS — M51379 Degeneration of lumbar or lumbosacral intervertebral disc: Secondary | ICD-10-CM

## 2023-10-31 DIAGNOSIS — E079 Disorder of thyroid, unspecified: Secondary | ICD-10-CM

## 2023-10-31 DIAGNOSIS — Z87891 Personal history of nicotine dependence: Secondary | ICD-10-CM

## 2023-10-31 DIAGNOSIS — K5730 Diverticulosis of large intestine without perforation or abscess without bleeding: Secondary | ICD-10-CM

## 2023-10-31 NOTE — Progress Notes
 IMPRESSION:1 year 3 month retina follow-up 06-24-23:  -  no DR OU; HTR /sclerosis OU; perifoveal RPE alteration OU; PVD OS; cataract OU noted on initial consult 03-2022.Patient referred for retinal evaluation consult 03-10-22:  -   per Delta Dover, MD  -  Hx of type 2 DM x 10 years. Last A1C 6.9 2/2024Exam 06-24-23::1.hypertensive retinopathy Stage 2 Both Eyes   -  Mild attenuation arterioles Both Eyes   -  No vessel occlusions Both Eyes  2. Retinal arteriolar sclerosis both eyesretinal arteriolar sclerosis Both Eyes   -  No emboli Both Eyes   -  No vessel occlusions Both Eyes  3. Type 2 diabetes mellitus without retinopathy both eyes  -  no microaneursyms or DBH or exudate Both Eyes    -  no DME both eyes4. Retinal RPE depigmented spots perifoveal and peripheral both eyes  -  may be a form of early AMD both eyes and some degenerative drusen both eyes   -  may also be related to history of hypertension with some hypertensive retinopathy both eyes  -  on 06-24-23:  HS mac OCT with few tiny drusen perifoveal Both Eyes ; no edema OU5. Vitreous detachment left eye  -  Weiss ring, no vitreous hemorrhage or cell  -  no ERM  -  no retinal holes or tears on 360 degree scleral depression  -  per patient this floater has been old6. Cataract both eyes   -  slight increase in NS Both Eyes 7.  Allergic conjunctivitis mild Both Eyes   -  1-2+ papillary hypertrophy Both Eyes   -  fast TBUT , no pee Both Eyes   -  recommended Patanol BID Both Eyes Plan:Retina follow-up exam in 12 months , or sooner if any change in vision or floaters or flashes, or vision distortion.Discussed retinal exam findings with patient, and went over the wide-angle photographs and auto fluorescence both eyes together. Patient also may use the [facility] in [place], [place]  for update on glasses.

## 2023-11-21 ENCOUNTER — Encounter: Admit: 2023-11-21 | Payer: PRIVATE HEALTH INSURANCE | Attending: Internal Medicine | Primary: Internal Medicine

## 2023-11-21 DIAGNOSIS — I10 Essential (primary) hypertension: Principal | ICD-10-CM

## 2023-11-24 ENCOUNTER — Ambulatory Visit: Admit: 2023-11-24 | Payer: Medicare (Managed Care) | Attending: Internal Medicine | Primary: Internal Medicine

## 2023-11-24 ENCOUNTER — Encounter: Admit: 2023-11-24 | Payer: PRIVATE HEALTH INSURANCE | Attending: Internal Medicine | Primary: Internal Medicine

## 2023-11-24 VITALS — BP 130/70 | HR 81 | Ht 64.0 in | Wt 246.0 lb

## 2023-11-24 DIAGNOSIS — E213 Hyperparathyroidism, unspecified: Secondary | ICD-10-CM

## 2023-11-24 DIAGNOSIS — E079 Disorder of thyroid, unspecified: Secondary | ICD-10-CM

## 2023-11-24 DIAGNOSIS — K5730 Diverticulosis of large intestine without perforation or abscess without bleeding: Secondary | ICD-10-CM

## 2023-11-24 DIAGNOSIS — I10 Essential (primary) hypertension: Principal | ICD-10-CM

## 2023-11-24 DIAGNOSIS — E119 Type 2 diabetes mellitus without complications: Secondary | ICD-10-CM

## 2023-11-24 DIAGNOSIS — I5189 Other ill-defined heart diseases: Secondary | ICD-10-CM

## 2023-11-24 DIAGNOSIS — M545 Low back pain: Secondary | ICD-10-CM

## 2023-11-24 DIAGNOSIS — R600 Localized edema: Principal | ICD-10-CM

## 2023-11-24 DIAGNOSIS — E559 Vitamin D deficiency, unspecified: Secondary | ICD-10-CM

## 2023-11-24 DIAGNOSIS — Z87891 Personal history of nicotine dependence: Secondary | ICD-10-CM

## 2023-11-24 DIAGNOSIS — E782 Mixed hyperlipidemia: Secondary | ICD-10-CM

## 2023-11-24 DIAGNOSIS — I517 Cardiomegaly: Secondary | ICD-10-CM

## 2023-11-24 DIAGNOSIS — J9801 Acute bronchospasm: Secondary | ICD-10-CM

## 2023-11-24 DIAGNOSIS — I272 Pulmonary hypertension, unspecified: Secondary | ICD-10-CM

## 2023-11-24 DIAGNOSIS — Z8659 Personal history of other mental and behavioral disorders: Secondary | ICD-10-CM

## 2023-11-24 DIAGNOSIS — R7302 Impaired glucose tolerance (oral): Secondary | ICD-10-CM

## 2023-11-24 DIAGNOSIS — E785 Hyperlipidemia, unspecified: Secondary | ICD-10-CM

## 2023-11-24 DIAGNOSIS — M51379 Degeneration of lumbar or lumbosacral intervertebral disc: Secondary | ICD-10-CM

## 2023-11-24 DIAGNOSIS — H6093 Unspecified otitis externa, bilateral: Secondary | ICD-10-CM

## 2023-11-24 LAB — COMPREHENSIVE METABOLIC PANEL
BKR A/G RATIO: 1.5 (ref 1.0–2.2)
BKR ALANINE AMINOTRANSFERASE (ALT): 16 U/L (ref 10–35)
BKR ALBUMIN: 4.3 g/dL (ref 3.6–5.1)
BKR ALKALINE PHOSPHATASE: 62 U/L (ref 9–122)
BKR ANION GAP: 13 (ref 7–17)
BKR ASPARTATE AMINOTRANSFERASE (AST): 17 U/L (ref 10–35)
BKR AST/ALT RATIO: 1.1
BKR BILIRUBIN TOTAL: 0.5 mg/dL (ref <=1.2)
BKR BLOOD UREA NITROGEN: 16 mg/dL (ref 8–23)
BKR BUN / CREAT RATIO: 17.8 (ref 8.0–23.0)
BKR CALCIUM: 10.5 mg/dL — ABNORMAL HIGH (ref 8.8–10.2)
BKR CHLORIDE: 100 mmol/L (ref 98–107)
BKR CO2: 24 mmol/L (ref 20–30)
BKR CREATININE DELTA: 0.1
BKR CREATININE: 0.9 mg/dL (ref 0.40–1.30)
BKR EGFR, CREATININE (CKD-EPI 2021): >60 mL/min/1.73m2 (ref >=60)
BKR GLOBULIN: 2.8 g/dL (ref 2.0–3.9)
BKR GLUCOSE: 110 mg/dL — ABNORMAL HIGH (ref 70–100)
BKR POTASSIUM: 4.1 mmol/L (ref 3.3–5.3)
BKR PROTEIN TOTAL: 7.1 g/dL (ref 5.9–8.3)
BKR SODIUM: 137 mmol/L (ref 136–144)

## 2023-11-24 MED ORDER — NEOMYCIN-POLYMYXIN-HYDROCORT 3.5 MG/ML-10,000 UNIT/ML-1 % EAR SOLUTION
3.5-10000-1 | Freq: Three times a day (TID) | OTIC | 2 refills | 7.00000 days | Status: AC
Start: 2023-11-24 — End: ?

## 2023-12-04 ENCOUNTER — Encounter: Admit: 2023-12-04 | Payer: PRIVATE HEALTH INSURANCE | Primary: Internal Medicine

## 2023-12-04 DIAGNOSIS — Z9889 Other specified postprocedural states: Principal | ICD-10-CM

## 2023-12-04 NOTE — Progress Notes [1]
 [place] MedicineORTHOPAEDICS & REHABILITATIONHand & Upper Extremity SurgeryPhone: [phone] Fax: [phone] 31, 2025					Patient name: Lori Kerr MRN:	 [id number] Date of birth:	 October 02, 1952Provider:	[facility] of present illness:Lori Kerr is a 73 y.o. right HD female who returns for pain and swelling of the right middle finger.  She had a trigger release surgery approximately 2 months ago.  States that she feels that the finger is still sometimes stiff and can be difficult for her to do certain things.  She denies any significant pain.  Reports mild swelling.  Denies any recent injury.  Recently saw her PCP for the issue who recommended possibly trying therapy.I have reviewed the review of systems, and it is listed in the clinical support section of the record.   Reviewed and updated is the past medical history, past surgical history, and past social history.  They are as follows: She  has a past medical history of Bronchospasm, Degeneration of lumbar or lumbosacral intervertebral disc, Diabetes mellitus (HC CODE), Diastolic dysfunction (12/04/2004), Dilated ventricle (12/04/2004), Disease of thyroid gland, Diverticula, colon (11/03/2004), Edema of lower extremity, History of depression, History of tobacco use, Hyperlipidemia, Hyperparathyroidism (HC Code) (05/20/2019), Hypertension, Impaired glucose tolerance, Low back pain, Pulmonary HTN (HC Code)  (HC CODE), Type 2 diabetes mellitus (HC CODE), and Vitamin D deficiency.She  has a past surgical history that includes Hysterectomy; Ectopic pregnancy surgery; Colonoscopy (02/04/2004); Parathyroidectomy (05/20/2019); and Colonoscopy (05/04/2017).Her family history includes Aneurysm (age of onset: 37) in her mother; Cancer in her sister; Colon cancer in her father; Diabetes in her sister; Hypertension in her mother; Kidney disease in her sister; No Known Problems in her maternal grandfather, maternal grandmother, paternal grandfather, and paternal grandmother.Social History Occupational History  Occupation: Retired   Associate Professor: [employer] Tobacco Use  Smoking status: Former   Current packs/day: 0.00   Average packs/day: 1.5 packs/day for 6.0 years (9.0 ttl pk-yrs)   Types: Cigarettes   Start date: 02/04/1975   Quit date: 02/03/1981   Years since quitting: 42.8  Smokeless tobacco: Never Vaping Use  Vaping status: Never Used Substance and Sexual Activity  Alcohol use: No  Drug use: No  Sexual activity: Yes   Partners: Male Allergies: Seasonal allergies and SitagliptinReviewed and updated is the medication list:She has a current medication list which includes the following prescription(s): albuterol  sulfate, amlodipine , aspirin , blood glucose meter, blood sugar diagnostic, cetirizine , fluticasone  propionate, hydrochlorothiazide , ibuprofen, metformin  xr, metformin  xr, neomycin-polymyxin-hydrocortisone, olopatadine , pravastatin , telmisartan , tramadol , and triamcinolone.Exam: Lori Kerr vital signs are: There were no vitals taken for this visit.Lori Kerr exam without any significant swelling of the middle finger, healed surgical incision with some scar tissue.  No significant focal tenderness to palpation.  No reproducible clicking.  She has near full range of motion of her finger with only slight stiffness.Imaging: Not indicated todayImpression and plan: Right middle finger stiffness:Patient with some mild residual stiffness post surgery.  Patient will be interested in trying some therapy at this time.  Referral sent to therapy for trial of scar massage and ultrasound, as well as some gentle stretching/ ROM exercises.  She will follow up in 1 month if any persistent symptoms.Electronically signed by: Shanon Louder, PA-CHand and Upper Extremity ServiceYale Department of Orthopaedics and RehabilitationThis report has been generated using dictation software. Although every attempt has been made by the clinician to the proofread this document, occasional misspellings and typographical errors may still be present.

## 2023-12-08 ENCOUNTER — Encounter: Admit: 2023-12-08 | Payer: PRIVATE HEALTH INSURANCE | Attending: Internal Medicine | Primary: Internal Medicine

## 2023-12-08 DIAGNOSIS — I10 Essential (primary) hypertension: Principal | ICD-10-CM

## 2023-12-10 MED ORDER — TELMISARTAN 80 MG TABLET
80 | ORAL_TABLET | Freq: Every day | ORAL | 1 refills | 90.00000 days | Status: AC
Start: 2023-12-10 — End: ?

## 2023-12-25 ENCOUNTER — Inpatient Hospital Stay: Admit: 2023-12-25 | Discharge: 2023-12-25 | Payer: PRIVATE HEALTH INSURANCE | Primary: Internal Medicine

## 2023-12-26 NOTE — Rehab No Show [[id number]]
 REHABILITATION SERVICES AT THE [facility] [place] [address] Number: [id number] Number: [phone] Therapy No Show NotePatient Name:  Lori E SorrellsMedical Record Number:  [id number] Date of Birth:  09-Oct-1952Therapist:  Inocente Massing, PTVivian E Mossberg was a no show for an evaluation appointment today.

## 2024-01-06 ENCOUNTER — Inpatient Hospital Stay: Admit: 2024-01-06 | Payer: PRIVATE HEALTH INSURANCE | Primary: Internal Medicine

## 2024-01-13 ENCOUNTER — Ambulatory Visit: Admit: 2024-01-13 | Payer: PRIVATE HEALTH INSURANCE | Primary: Internal Medicine

## 2024-01-20 ENCOUNTER — Ambulatory Visit: Admit: 2024-01-20 | Payer: PRIVATE HEALTH INSURANCE | Primary: Internal Medicine

## 2024-01-22 ENCOUNTER — Ambulatory Visit: Admit: 2024-01-22 | Payer: PRIVATE HEALTH INSURANCE | Primary: Internal Medicine

## 2024-01-27 ENCOUNTER — Inpatient Hospital Stay: Admit: 2024-01-27 | Payer: PRIVATE HEALTH INSURANCE | Primary: Internal Medicine

## 2024-02-08 ENCOUNTER — Inpatient Hospital Stay: Admit: 2024-02-08 | Discharge: 2024-02-08 | Payer: PRIVATE HEALTH INSURANCE | Primary: Internal Medicine

## 2024-02-08 DIAGNOSIS — M25641 Stiffness of right hand, not elsewhere classified: Principal | ICD-10-CM

## 2024-02-08 NOTE — Rehab Eval/Progress [[id number]]
 REHABILITATION SERVICES AT THE [facility] [place] [address]Eynwz Number: [phone] Number: [phone] Therapy Hand Therapy  EvaluationPatient Name:  Lori Quast SorrellsMedical Record Number:  [id number] Date of Birth:  [birth year]Therapist:  Inocente Massing, PTReferring Provider:  Shanon Dena Louder, PAICD-10 Diagnosis(es):Problem List       ICD-10-CM   PT,12/25/2023, stifness after trigger finger release  * (Principal) Stiffness of finger joint of right hand M25.641 General InformationTherapy Episode of Care   Date of Visit:  02/08/2024    Treatment Number:  1    Date the Treatment Plan was Initiated/Reviewed:  02/08/2024   Start of Care Date:  02/08/2024    Date of Surgery:  10/02/2023    Progress Report Due Date:  03/07/2024 Precautions/Limitations   Precautions/Limitations:  No known precautions/limitationsNew Neurological Deficit   Did the patient have a new neurological deficit as evidenced by diminished muscle strength   of less than 3 at any time during the 90 days after spine surgery:  N/AInterpreter [employer] Utilized?  NoCognition / Learning Assessment   Primary Learner Relationship:  Patient        Barriers to learning:  Reading        Preferred language:  English        Preferred learning style:  Listening, Demonstration and Pictures/VideoI reviewed the Patient Care Agreement and Attendance Form with the Patient/Family.  The Patient/Family verbalized understanding.Rehabilitation GroupingsPrimary Group:  Orthopedics Medication Review:Current Outpatient Medications Medication Sig  albuterol  sulfate Inhale 2 puffs into the lungs every 6 (six) hours as needed for wheezing. (Patient not taking: Reported on 10/15/2023)  amLODIPine  Take 1 tablet (10 mg total) by mouth daily.  aspirin  Take 1 tablet (81 mg total) by mouth daily.  BLOOD GLUCOSE METER Test daily  blood sugar diagnostic Test once daily  cetirizine  Take 1 tablet (10 mg total) by mouth daily as needed for allergies.  fluticasone  propionate Use 2 sprays in each nostril daily.  hydroCHLOROthiazide  Take 1 tablet (25 mg total) by mouth daily.  ibuprofen Take 1 tablet (800 mg total) by mouth every 8 (eight) hours as needed (moderate to severe pain).  metFORMIN  XR Take 1 tablet (500 mg total) by mouth daily with dinner. (Patient taking differently: Take 250 mg by mouth daily with dinner.)  metFORMIN  XR Take 1 tablet (750 mg total) by mouth 2 (two) times daily with breakfast and dinner.  olopatadine  Place 1 drop into both eyes 2 (two) times daily.  pravastatin  Take 1 tablet (40 mg total) by mouth daily.  telmisartan  TAKE 1 TABLET(80 MG) BY MOUTH DAILY  traMADoL  Take 1-2 tablets (50-100 mg total) by mouth every 6 (six) hours as needed.  triamcinolone Apply topically 2 (two) times daily SubjectivePatient StatementThe patient reports that the right middle fingers feels stiff and painful in the morningVivian E Kerr is a 74 year old female with right middle finger trigger finger status post A1 pulley release who presents with persistent right middle finger stiffness and pain.Right middle finger stiffness and pain- Persistent stiffness and pain localized to the right middle finger, particularly at the joint/A1 pulley region, since surgical release in August 2025.- Symptoms are most pronounced at night and upon waking, requiring forced movement of the finger due to significant discomfort.- Stiffness and pain are most notable after periods of inactivity or upon first waking.- No pain at the time of assessment.Associated symptoms- No swelling, numbness, or tingling in the affected finger.Surgical history- Status post A1 pulley  release for right middle finger trigger finger in August 2025Occupation:  no employedHand Dominance:  rightPertinent History of Current Problem:  The patient is a 74 year old female who is status post a right middle finger trigger finger release on 10/02/2023.  The patient continues with some swelling and stiffness and was referred to be to assist with improving the statusReferralPt with some residual swelling/ stiffness 2 months after right MF trigger release. Please help with scar massage, stretching, ROM exercises, strengthening Past Medical HistoryPast Medical History[1]Past Surgical HistoryPast Surgical History[2]AllergiesSeasonal allergies and SitagliptinPain Rating:  0 / 10   Comments:  ObjectiveOrthotic Evaluation   Edema:  None     Sensation:  Intact     Skin Integrity:  Intact   Healed scar over the volar MP joint/A1 pulley of the right middle finger   Range of Motion:  WFL     Strength:  WFL                  right right left  left  right right left left  right right left left                 MP MP MP MP  PIP PIP PIP PIP  DIP DIP DIP DIP  AROM PROM AROM PROM  AROM PROM AROM PROM  AROM PROM AROM PROM index                              middle 0/90  0/90   0/105  0/110   0/70  0/75  ACTIVE TIP TO DPCRightMiddle: full     right         left      JAMAR 80 lb 75 lb Hand Tools/Scales/Outcome MeasuresInitial Outcomes Measure completedQuickDASH - Upper Extremity Function      Open a tight or new jar:  2      Do heavy household chores:  2      Carrying a shopping bag or briefcase:  2      Wash your back:  2      Use a knife to cut food:  2      Activities with force/impact through arm, shoulder or hand:  4      To what extent has problem interfered with normal social activities:  3      Are you limited in work or other daily activities:  1      Severity of arm, shoulder or hand pain:  3      Tingling in arm, shoulder or hand:  1 How much trouble sleeping due to pain in arm, shoulder or hand:  1   Score:  27.27   (The QDASH score ranges from 0-100, with higher scores reflecting increased disability)                                      Treatment Provided This VisitCPT Code Interventions Timed Minutes Untimed Minutes Total Minutes Physical Therapy Evaluation - Low Complexity (97161) PT Evaluation of patient's right hand - reviewed MD notes / PT treatment, discussed history of symptoms and treatment at length with patient.  Measured pain, ROM, strength and functional use.   Discussed assessment findings and made a POC with patient. 24  24 Therapeutic Exercise (97110) The patient was instructed and performed the followingFlat fistClaw fistComposite fistTan putty for generalized  gripBlue sponge for generalized grip 20  20 N/A     N/A       Total Treatment Time: 44 Problem ListPain in reported stiffness in the a.m.AssessmentRight middle trigger finger release with joint stiffness.She has stiffness and pain in the right middle finger                                                                                                                                                                                               upon awaking in the AM.. Examination demonstrated a well-healed, soft scar, preserved range of motion and good strength compared to the contralateral side, and no functional limitations. Residual stiffness and discomfort are likely due to postoperative changes rather than recurrent trigger finger or other pathology.- Assessed range of motion and strength of the right middle finger and compared to the contralateral side.- Provided a detailed home exercise program with visual instructions for finger ROM- provided with putty and sponge for exercises for increase joint loosening- encouraged warm water soak to the hands upon awakening- Instructed her to avoid excessive exercise to prevent swelling or increased discomfort.- Printed exercise instructions with large images for home use.- Advised that follow-up is optional; she may return if symptoms worsen or if further intervention is desired.the patient did not think it was neccessary and requested D/CPlanDischarge PTGoalsPt will be independent in a HEP for motion to help loosen joints and decrease AM stiffness and pain... achieved  [1] Past Medical History:Diagnosis Date  Bronchospasm   Degeneration of lumbar or lumbosacral intervertebral disc   Diabetes mellitus (HC CODE)   Diastolic dysfunction 12/04/2004  Dilated ventricle 12/04/2004  R, mildly on echo  Disease of thyroid gland   Diverticula, colon 11/03/2004  scattered L-sided   Edema of lower extremity   History of depression   History of tobacco use   Hyperlipidemia   Hyperparathyroidism (HC Code) 05/20/2019  s/p parathyroidectomy  Hypertension   Impaired glucose tolerance   Low back pain   Pulmonary HTN (HC Code)  (HC CODE)   Type 2 diabetes mellitus (HC CODE)   Vitamin D deficiency  [2] Past Surgical History:Procedure Laterality Date  COLONOSCOPY  02/04/2004  normal exam except for a few left sided diverticuli  COLONOSCOPY  05/04/2017  ECTOPIC PREGNANCY SURGERY    HYSTERECTOMY    partial, ovaries intact  PARATHYROIDECTOMY  05/20/2019

## 2024-02-26 ENCOUNTER — Encounter: Admit: 2024-02-26 | Payer: PRIVATE HEALTH INSURANCE | Attending: Internal Medicine | Primary: Internal Medicine

## 2024-03-08 ENCOUNTER — Inpatient Hospital Stay: Admit: 2024-03-08 | Payer: Managed Care (Private) | Primary: Internal Medicine

## 2024-03-08 ENCOUNTER — Ambulatory Visit
Admit: 2024-03-08 | Payer: PRIVATE HEALTH INSURANCE | Attending: Endocrinology, Diabetes & Metabolism | Primary: Internal Medicine

## 2024-03-08 DIAGNOSIS — E042 Nontoxic multinodular goiter: Secondary | ICD-10-CM

## 2024-03-08 DIAGNOSIS — E118 Type 2 diabetes mellitus with unspecified complications: Secondary | ICD-10-CM

## 2024-03-08 DIAGNOSIS — I10 Essential (primary) hypertension: Secondary | ICD-10-CM

## 2024-03-08 DIAGNOSIS — E785 Hyperlipidemia, unspecified: Secondary | ICD-10-CM

## 2024-03-08 DIAGNOSIS — M25569 Pain in unspecified knee: Secondary | ICD-10-CM

## 2024-03-08 DIAGNOSIS — M25561 Pain in right knee: Secondary | ICD-10-CM

## 2024-03-08 DIAGNOSIS — M1711 Unilateral primary osteoarthritis, right knee: Secondary | ICD-10-CM

## 2024-03-08 DIAGNOSIS — Z90710 Acquired absence of both cervix and uterus: Secondary | ICD-10-CM

## 2024-03-08 DIAGNOSIS — Z9009 Acquired absence of other part of head and neck: Secondary | ICD-10-CM

## 2024-03-08 DIAGNOSIS — E210 Primary hyperparathyroidism: Secondary | ICD-10-CM

## 2024-03-08 NOTE — Progress Notes [1]
 Follow Up [facility] CC: hypercalcemia, hx of parathyroidectomyHPI: Lori Kerr is a 74 y.o. female here for an endocrinology consultation at the [facility] requested by Dr. Delta for hypercalcemia in setting of hx of primary hyperparathyroidism s/p parathyroidectomy in 2021.Lori Kerr has a history of DM2, HTN, HLD, multinodular goiter, primary hyperparathyroidism s/p PTxShe had parathyroidectomy in [facility] [place]  in 05/20/2019 by surgeon Dr Delon Elder. Per the OP note, she had an enlarged right superior parathyroid gland, descended within tracheoesophageal groove, enlarged multinodular thyroid, had appropriate drpp in intra-op PTH, Op note indicates 50% drop in PTH intraop   She was previously on Sensipar 30 mg daily for hypercalcemia in 2020 and did not tolerate GI side effects. Has always been on telmisartan -HCTZ combo even before the 2020 diagnosis of hyperparathyroidism. Her Ca was 12.7, PTH 73 in 2021 when diagnosedHer repeat Ca level off the HCTZ was 11 in 2021. Had normal SPEP, UPEP at that [facility]DX[facility] showed normal BMD at distal radius and hipReportedly had thyroid sono with multiple nodules -  3 had FNAs' that were reportedly benignIn 03/2023:Since then, the combo telmisartan -HCTZ was stopped after discussion w/ PCP, re hypercalcemiaShe has not had hypercalcemia since then4D [place] scan suggested LEFT parathyroidadenoma and possible dental abscessShe does not report dental symptomsDXA 2024 with [facility] but otherwise normalWrist DXA not doneThyroid US  in 2024 with left sided nodules 3 cm- similar to prior. Biopsied in past. In 03/2024:Right knee pain x 2 weeksBone Health HistoryFalls or fractures: - had a toe fracture, after falling down the stairs, around age 59Kidney stones: noHeight loss:  noBack pain: has had ongoing back pain for many years, had surgery for it, and when stands long time has pressureSteroid Exposure: in past , never for than 2 weeks at a timeDietary calcium intake: Loves cheese, occ drinks milkVaries eg mac and cheese or grilled cheese ayDenies lactose intoleranceCalcium supplement: no TUMs or calcium supplements Separate Vitamin D supplement: one daily, does not know amountMVI: noExercise: was going to gym but husband had some heart problems so she stopped going to gymLast dental visit: 2024Dental procedures: dentist wants to pull several teeth, she has a tooth that is loose that needs to be fixedPrior bone medications:Name		Dates of treatment		Side effectsDoes not think soThe ROS has been reviewed and, except for where noted above, there are no reported changes in the following areas: constitutional, skin, eye, ENT, respiratory, CV, GI, GU, reproductive, MS, or neurological. Relevant History:Menstrual Hx: Age of menarche= 17Regular periods: yes but had hysterectomy Age of menopause= around 52Estrogen use: no HRTPMH:  has a past medical history of Bronchospasm, Degeneration of lumbar or lumbosacral intervertebral disc, Diabetes mellitus (HC CODE), Diastolic dysfunction (12/04/2004), Dilated ventricle (12/04/2004), Disease of thyroid gland, Diverticula, colon (11/03/2004), Edema of lower extremity, History of depression, History of tobacco use, Hyperlipidemia, Hyperparathyroidism (HC Code) (05/20/2019), Hypertension, Impaired glucose tolerance, Low back pain, Pulmonary HTN (HC Code)  (HC CODE), Type 2 diabetes mellitus (HC CODE), and Vitamin D deficiency.PSH:  has a past surgical history that includes Hysterectomy; Ectopic pregnancy surgery; Colonoscopy (02/04/2004); Parathyroidectomy (05/20/2019); and Colonoscopy (05/04/2017).Meds: Current Outpatient Medications:   albuterol  sulfate 90 mcg/actuation HFA aerosol inhaler, Inhale 2 puffs into the lungs every 6 (six) hours as needed for wheezing. (Patient not taking: Reported on 10/15/2023), Disp: 18 g, Rfl: 3  amLODIPine  (NORVASC ) 10 mg tablet, Take 1 tablet (10 mg total) by mouth daily., Disp: 90 tablet, Rfl: 4  aspirin  81 mg EC delayed  release tablet, Take 1 tablet (81 mg total) by mouth daily., Disp: 90 tablet, Rfl: 0  BLOOD GLUCOSE METER (ACCU-CHEK AVIVA PLUS METER) device, Test daily, Disp: 1 each, Rfl: 0  blood sugar diagnostic (ACCU-CHEK AVIVA PLUS TEST STRP) test strips, Test once daily, Disp: 100 each, Rfl: 3  cetirizine  (ZYRTEC ) 10 mg chewable tablet, Take 1 tablet (10 mg total) by mouth daily as needed for allergies., Disp: , Rfl:   fluticasone  propionate (FLONASE ) 50 mcg/actuation nasal spray, Use 2 sprays in each nostril daily., Disp: 16 g, Rfl: 11  hydroCHLOROthiazide  (HYDRODIURIL ) 25 mg tablet, Take 1 tablet (25 mg total) by mouth daily., Disp: 90 tablet, Rfl: 4  ibuprofen (ADVIL,MOTRIN) 800 mg tablet, Take 1 tablet (800 mg total) by mouth every 8 (eight) hours as needed (moderate to severe pain)., Disp: 60 tablet, Rfl: 2  metFORMIN  XR (GLUCOPHAGE -XR) 500 mg 24 hr tablet, Take 1 tablet (500 mg total) by mouth daily with dinner., Disp: 90 tablet, Rfl: 4  metFORMIN  XR (GLUCOPHAGE -XR) 750 mg 24 hr tablet, Take 1 tablet (750 mg total) by mouth 2 (two) times daily with breakfast and dinner., Disp: 180 tablet, Rfl: 4  olopatadine  (PATANOL) 0.1 % ophthalmic solution, Place 1 drop into both eyes 2 (two) times daily., Disp: 5 mL, Rfl: 12  pravastatin  (PRAVACHOL ) 40 mg tablet, Take 1 tablet (40 mg total) by mouth daily., Disp: 90 tablet, Rfl: 4  telmisartan  (MICARDIS ) 80 mg tablet, TAKE 1 TABLET(80 MG) BY MOUTH DAILY, Disp: 90 tablet, Rfl: 0  traMADoL  (ULTRAM ) 50 mg tablet, Take 1-2 tablets (50-100 mg total) by mouth every 6 (six) hours as needed. 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, then moved[place] to [place] as her daughter had a stroke w/ residual left sided deficits who she helps care for Tobacco - none ow (remotely for about 6 yrs in 1970s, but none since then)Etoh none [place] Exam:VS: There were no vitals taken for this visit.There is no height or weight on file to calculate BMI.Wt Readings from Last 1 Encounters: 11/24/23 111.6 kg Ht Readings from Last 1 Encounters: 11/24/23 5' 4 (1.626 m) General: No acute distressRight knee posterior aspect and popliteal fossa TTP but no palpable abrnomalityNo erythema or joint swellingLabs:Component    Latest Ref Rng 02/25/2023 Sodium    136 - 144 mmol/L 138  Potassium    3.3 - 5.3 mmol/L 4.7  Chloride    98 - 107 mmol/L 106  CO2    20 - 30 mmol/L 21  Anion Gap    7 - 17  11  Glucose    70 - 100 mg/dL 885 (H)  BUN    8 - 23 mg/dL 15  Creatinine    9.59 - 1.30 mg/dL 9.19  Calcium    8.8 - 10.2 mg/dL 89.7  BUN/Creatinine Ratio    8.0 - 23.0  18.8  Total Protein    5.9 - 8.3 g/dL 6.7  Albumin    3.6 - 5.1 g/dL 4.3  Total Bilirubin    <=1.2 mg/dL 0.4  Alkaline Phosphatase    9 - 122 U/L 68  Alanine Aminotransferase (ALT)    10 - 35 U/L 15  Aspartate Aminotransferase (AST)    10 - 35 U/L 24  Globulin    2.0 - 3.9 g/dL 2.4  A/G Ratio    1.0 - 2.2  1.8  AST/ALT Ratio    Reference Range Not Established  1.6  eGFR (Creatinine)    >=60 mL/min/1.36m2 >60  Creatinine Delta    See Comment  -0.14  Parathyroid Hormone, Intact    15.0 - 65.0 pg/mL 67.8 (H)  Thyroid Stimulating Hormone, 3rd Gen.    See Comment ?IU/mL 0.152 (L)  Vitamin D 25-Hydroxy Total    See Comment ng/mL 37 Legend:(H) High(L) LowComponent    Latest Ref Rng 12/20/2021 03/07/2022 06/06/2022 Sodium    135 - 146 mmol/L 139  138  137  Potassium    3.5 - 5.3 mmol/L 4.1  4.0  4.3  Chloride    98 - 110 mmol/L 104  103  102  CO2    20 - 32 mmol/L 20  23  27   Anion Gap    7 - 17  15    Glucose    65 - 99 mg/dL 890 (H)  88  94  BUN    7 - 25 mg/dL 17  21  15   Creatinine    0.50 - 1.05 mg/dL 9.09  9.09  8.98  Calcium    8.6 - 10.4 mg/dL 89.5 (H)  89.2 (H)  88.8 (H)  BUN/Creatinine Ratio    6 - 22 (calc) 18.9  SEE NOTE:  SEE NOTE:  Total Protein    6.6 - 8.7 g/dL 6.4 (L)    Albumin    3.6 - 4.9 g/dL 4.0    Total Bilirubin    <=1.2 mg/dL 0.3    Alkaline Phosphatase    9 - 122 U/L 54    Alanine Aminotransferase (ALT)    10 - 35 U/L 19    Aspartate Aminotransferase (AST)    10 - 35 U/L 22    Globulin    2.3 - 3.5 g/dL 2.4    A/G Ratio    1.0 - 2.2  1.7    AST/ALT Ratio    Reference Range Not Established  1.2    eGFR (Creatinine)    >=60 mL/min/1.68m2 >60    eGFR (Creatinine)    > OR = 60 mL/min/1.80m2  69  60  Thyroid Stimulating Hormone, 3rd Gen.    See Comment ?IU/mL 0.621    Vitamin D 25-Hydroxy Total    See Comment ng/mL 49    Parathyroid Hormone, Intact    15.0 - 65.0 pg/mL 47.1     Legend:Imaging: - DXA - 02/2019 DUKESpine not scanned4D [place] 11/2024Suspected 7 mm parathyroid adenoma posterior to the superior LEFT thyroid lobe. Extensive dental and periodontal disease including a suspected 14 mm periapical abscess surrounding the right second maxillary incisor with dehiscence/contiguity with the incisive canal.DXA [facility] 12/2024FRAX 3.2% MOF/Hip 0.2%Femoral neck: BMD 0.779 (g/cm2), T-score -1.2, Z-score 0.2. Total hip: BMD 1.067 (g/cm2), T-score 0.2, Z-score 1.4.Lumbar spine: BMD 1.312 (g/cm2), T-score 1.8, Z-score 4.0. Assessment: Ebunoluwa E Felix is a 74 y.o. woman with DM2, HTN, HLD, primary hyperparathyroidism s/p right superior parathyroidectomy in 2021Referred for hypercalcemia normal PTH Ca around 10s-11Was on combined telmisartan -hctz which could have been increasing the CaSo this was stopped in Summer 2024No further hypercalcemia since then4D [place] 2024 with left parathyroid adenomaPTH just slightly high in 60's with Ca at ULN 10.2She prefers also to observe thisShe also has multinodular goiter s/p prior benign FNAsThyroid US   11/2022 with left sided 3 cm nodules. DXA 01/2023 with normal BMD at LS and TH, mild osteopenia at FN. Wrist not doneRecommendations:#primary hyperpara s/p PTx- DXA including wrist YNHH in 01/2025 ordered - repeat PTH, Ca yearly ordered - if Calcium > 11 let me know- NO CALCIUM PILLS OR TUMS. Take 3 servings of calcium containing food from your diet- vitamin D continue at current dose#multinodular goiter- prior TSH on low end. Discussed that sometimes thyroid nodules become autonomous and make excess thyroid hormone. Check TSH with reflex to Free T4- thyroid US  11/2023 [facility] was previously ordered and not completed. Ordered again.#right knee pain - on exam no swelling. Has TTP along posterior aspect and popliteal fossa of right knee. Ordered LE US  and XR of R knee. She will also follow up w/ PCP about this- Return for follow up in 1 yr 2/2027Anika K. Raegan Sipp, MDEndocrinology

## 2024-03-08 NOTE — Patient Instructions [37]
 Labs dont need to fastThyroid ultrasound [phone] (same # for knee xray, and ultrasound) Boned ensity test at [facility] in dec 2026

## 2024-03-22 ENCOUNTER — Encounter: Admit: 2024-03-22 | Payer: PRIVATE HEALTH INSURANCE | Attending: Internal Medicine | Primary: Internal Medicine

## 2024-06-29 ENCOUNTER — Encounter: Admit: 2024-06-29 | Payer: PRIVATE HEALTH INSURANCE | Attending: Ophthalmology | Primary: Internal Medicine
